# Patient Record
Sex: Male | Born: 1950
Health system: Southern US, Community
[De-identification: ages and names within clinical notes are randomized; demographics above are authoritative.]

## PROBLEM LIST (undated history)

## (undated) DIAGNOSIS — J439 Emphysema, unspecified: Secondary | ICD-10-CM

## (undated) HISTORY — DX: Emphysema, unspecified: J43.9

---

## 2003-06-14 ENCOUNTER — Emergency Department (HOSPITAL_COMMUNITY): Admission: EM | Admit: 2003-06-14 | Discharge: 2003-06-14 | Payer: Self-pay | Admitting: Emergency Medicine

## 2008-06-13 HISTORY — PX: CHOLECYSTECTOMY: SHX55

## 2009-02-19 ENCOUNTER — Inpatient Hospital Stay (HOSPITAL_COMMUNITY): Admission: EM | Admit: 2009-02-19 | Discharge: 2009-02-22 | Payer: Self-pay | Admitting: Emergency Medicine

## 2009-02-19 ENCOUNTER — Ambulatory Visit: Payer: Self-pay | Admitting: Gastroenterology

## 2009-02-20 ENCOUNTER — Encounter (INDEPENDENT_AMBULATORY_CARE_PROVIDER_SITE_OTHER): Payer: Self-pay | Admitting: Internal Medicine

## 2009-02-20 ENCOUNTER — Encounter: Payer: Self-pay | Admitting: Gastroenterology

## 2009-02-21 ENCOUNTER — Encounter (INDEPENDENT_AMBULATORY_CARE_PROVIDER_SITE_OTHER): Payer: Self-pay | Admitting: Surgery

## 2010-09-17 LAB — CBC
HCT: 42.7 % (ref 39.0–52.0)
HCT: 43.7 % (ref 39.0–52.0)
Hemoglobin: 14.6 g/dL (ref 13.0–17.0)
Hemoglobin: 15 g/dL (ref 13.0–17.0)
MCHC: 34.1 g/dL (ref 30.0–36.0)
MCHC: 34.2 g/dL (ref 30.0–36.0)
MCHC: 34.5 g/dL (ref 30.0–36.0)
MCV: 94.7 fL (ref 78.0–100.0)
MCV: 95.1 fL (ref 78.0–100.0)
MCV: 95.5 fL (ref 78.0–100.0)
Platelets: 155 10*3/uL (ref 150–400)
Platelets: 185 10*3/uL (ref 150–400)
RBC: 4.27 MIL/uL (ref 4.22–5.81)
RBC: 4.47 MIL/uL (ref 4.22–5.81)
RBC: 4.62 MIL/uL (ref 4.22–5.81)
RDW: 13.5 % (ref 11.5–15.5)
RDW: 13.6 % (ref 11.5–15.5)
WBC: 10 10*3/uL (ref 4.0–10.5)
WBC: 15.7 10*3/uL — ABNORMAL HIGH (ref 4.0–10.5)

## 2010-09-17 LAB — HEPATIC FUNCTION PANEL
ALT: 75 U/L — ABNORMAL HIGH (ref 0–53)
AST: 129 U/L — ABNORMAL HIGH (ref 0–37)
Albumin: 2.5 g/dL — ABNORMAL LOW (ref 3.5–5.2)
Albumin: 3.3 g/dL — ABNORMAL LOW (ref 3.5–5.2)
Alkaline Phosphatase: 72 U/L (ref 39–117)
Bilirubin, Direct: 0.3 mg/dL (ref 0.0–0.3)
Indirect Bilirubin: 0.5 mg/dL (ref 0.3–0.9)
Total Bilirubin: 0.8 mg/dL (ref 0.3–1.2)
Total Protein: 5.1 g/dL — ABNORMAL LOW (ref 6.0–8.3)
Total Protein: 5.7 g/dL — ABNORMAL LOW (ref 6.0–8.3)

## 2010-09-17 LAB — BASIC METABOLIC PANEL
BUN: 11 mg/dL (ref 6–23)
CO2: 27 mEq/L (ref 19–32)
Calcium: 8.4 mg/dL (ref 8.4–10.5)
Chloride: 107 mEq/L (ref 96–112)
Creatinine, Ser: 1.08 mg/dL (ref 0.4–1.5)
GFR calc Af Amer: >60 mL/min (ref >60)
GFR calc non Af Amer: >60 mL/min (ref >60)
Glucose, Bld: 117 mg/dL — ABNORMAL HIGH (ref 70–99)
Potassium: 3.7 mEq/L (ref 3.5–5.1)
Sodium: 142 mEq/L (ref 135–145)

## 2010-09-17 LAB — COMPREHENSIVE METABOLIC PANEL
ALT: 437 U/L — ABNORMAL HIGH (ref 0–53)
ALT: 562 U/L — ABNORMAL HIGH (ref 0–53)
AST: 190 U/L — ABNORMAL HIGH (ref 0–37)
Alkaline Phosphatase: 112 U/L (ref 39–117)
BUN: 7 mg/dL (ref 6–23)
CO2: 22 mEq/L (ref 19–32)
CO2: 24 mEq/L (ref 19–32)
Calcium: 8.1 mg/dL — ABNORMAL LOW (ref 8.4–10.5)
Chloride: 113 mEq/L — ABNORMAL HIGH (ref 96–112)
Creatinine, Ser: 0.86 mg/dL (ref 0.4–1.5)
Creatinine, Ser: 0.9 mg/dL (ref 0.4–1.5)
GFR calc Af Amer: >60 mL/min (ref >60)
GFR calc non Af Amer: >60 mL/min (ref >60)
GFR calc non Af Amer: >60 mL/min (ref >60)
Glucose, Bld: 93 mg/dL (ref 70–99)
Total Bilirubin: 2 mg/dL — ABNORMAL HIGH (ref 0.3–1.2)

## 2010-09-17 LAB — LIPASE, BLOOD: Lipase: 26 U/L (ref 11–59)

## 2010-09-17 LAB — LACTIC ACID, PLASMA: Lactic Acid, Venous: 2 mmol/L (ref 0.5–2.2)

## 2010-09-17 LAB — CULTURE, BLOOD (ROUTINE X 2)
Culture: NO GROWTH
Culture: NO GROWTH

## 2010-09-17 LAB — URINALYSIS, ROUTINE W REFLEX MICROSCOPIC
Bilirubin Urine: NEGATIVE
Glucose, UA: NEGATIVE mg/dL
Hgb urine dipstick: NEGATIVE
Ketones, ur: NEGATIVE mg/dL
Nitrite: NEGATIVE
Protein, ur: NEGATIVE mg/dL
Specific Gravity, Urine: >1.046 — ABNORMAL HIGH (ref 1.005–1.030)
Urobilinogen, UA: 4 mg/dL — ABNORMAL HIGH (ref 0.0–1.0)
pH: 6 (ref 5.0–8.0)

## 2010-09-17 LAB — POCT I-STAT, CHEM 8
BUN: 14 mg/dL (ref 6–23)
Calcium, Ion: 1 mmol/L — ABNORMAL LOW (ref 1.12–1.32)
Chloride: 105 mEq/L (ref 96–112)
Creatinine, Ser: 0.9 mg/dL (ref 0.4–1.5)
Glucose, Bld: 106 mg/dL — ABNORMAL HIGH (ref 70–99)
HCT: 45 % (ref 39.0–52.0)
Hemoglobin: 15.3 g/dL (ref 13.0–17.0)
Potassium: 3.7 mEq/L (ref 3.5–5.1)
Sodium: 141 mEq/L (ref 135–145)
TCO2: 25 mmol/L (ref 0–100)

## 2010-09-17 LAB — URINE CULTURE
Colony Count: NO GROWTH
Culture: NO GROWTH

## 2010-09-17 LAB — DIFFERENTIAL
Basophils Absolute: 0 10*3/uL (ref 0.0–0.1)
Basophils Absolute: 0 10*3/uL (ref 0.0–0.1)
Basophils Relative: 0 % (ref 0–1)
Basophils Relative: 0 % (ref 0–1)
Eosinophils Absolute: 0.1 10*3/uL (ref 0.0–0.7)
Eosinophils Absolute: 0.2 10*3/uL (ref 0.0–0.7)
Eosinophils Absolute: 0.2 10*3/uL (ref 0.0–0.7)
Eosinophils Relative: 1 % (ref 0–5)
Eosinophils Relative: 2 % (ref 0–5)
Lymphocytes Relative: 19 % (ref 12–46)
Lymphocytes Relative: 8 % — ABNORMAL LOW (ref 12–46)
Lymphocytes Relative: 8 % — ABNORMAL LOW (ref 12–46)
Lymphs Abs: 1.2 10*3/uL (ref 0.7–4.0)
Lymphs Abs: 1.3 10*3/uL (ref 0.7–4.0)
Monocytes Absolute: 0.9 10*3/uL (ref 0.1–1.0)
Monocytes Relative: 6 % (ref 3–12)
Neutro Abs: 13.5 10*3/uL — ABNORMAL HIGH (ref 1.7–7.7)
Neutrophils Relative %: 68 % (ref 43–77)
Neutrophils Relative %: 86 % — ABNORMAL HIGH (ref 43–77)

## 2010-09-17 LAB — MAGNESIUM: Magnesium: 1.9 mg/dL (ref 1.5–2.5)

## 2010-09-17 LAB — PROTIME-INR
INR: 1 (ref 0.00–1.49)
Prothrombin Time: 13.3 seconds (ref 11.6–15.2)

## 2010-09-17 LAB — APTT: aPTT: 25 seconds (ref 24–37)

## 2010-09-17 LAB — CK TOTAL AND CKMB (NOT AT ARMC)
CK, MB: 1.4 ng/mL (ref 0.3–4.0)
Relative Index: INVALID (ref 0.0–2.5)
Total CK: 95 U/L (ref 7–232)

## 2010-09-17 LAB — POCT CARDIAC MARKERS
CKMB, poc: <1 ng/mL — ABNORMAL LOW (ref 1.0–8.0)
Myoglobin, poc: 65.5 ng/mL (ref 12–200)
Troponin i, poc: <0.05 ng/mL (ref 0.00–0.09)

## 2010-09-17 LAB — TROPONIN I: Troponin I: <0.01 ng/mL (ref 0.00–0.06)

## 2010-10-29 NOTE — Consult Note (Signed)
Eugene Patel, Eugene Patel                           ACCOUNT NO.:  [id number]   MEDICAL RECORD NO.:  [id number]                   PATIENT TYPE:  EMS   LOCATION:  MINO                                 FACILITY:  [facility]   PHYSICIAN:  Eugene Fitch. Naaman Patel., M.D.          DATE OF BIRTH:  May 29, 1951   DATE OF CONSULTATION:  06/14/2003  DATE OF DISCHARGE:                                   CONSULTATION   HAND TRAUMA CALL CONSULTATION NOTE   CHIEF COMPLAINT:  Wood splitter crushing injury right small finger leading  to amputation of the nail, pulp and distal segment.   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  He sustained a crushing, avulsive amputation of his  right small finger.  He was transported to the [facility] Emergency Room where he  was evaluated by Dr. Effie Shy and the other emergency room staff.  X-ray of his  finger documented amputation of the tuft of his distal phalanx and  inspection of the finger revealed an oblique amputation with avulsion of the  pulp, 60% of the nail plate and nail matrix, and significant soiling with  foreign material.   His past medical history was reviewed in detail.   Eugene Patel has no drug allergies.  He is on no medications at this time.  He  has had no prior surgery, and no serious illnesses.  There is no history of  diabetes, thyroid disease, or hypertension.   SOCIAL HISTORY:  He is married.  He has smoked one pack of cigarettes daily  for 35 years.  He has not had an alcoholic beverage in 17 years.  His last  solid food was at 7 a.m.   FAMILY HISTORY:  Detailed and noncontributory.   REVIEW OF SYSTEMS:  Detailed and otherwise noncontributory.   PHYSICAL EXAMINATION:  VITAL SIGNS:  Temperature 97.1, pulse 80 regular,  respirations 20, his blood  pressure was 108/70 sitting left arm.  CLINICAL EXAMINATION:  Revealed an awake and alert 60 year old man with an  obvious injury to his right hand with his small finger covered with a bloody  gauze.  HEENT:  Atraumatic.  NECK:  Had full range of motion without pain.  CHEST:  Clear to auscultation.  HEART:  Regular rate and rhythm.  S1 and S2.  No murmur or gallop  appreciated.  He had no lymphadenopathy.  ABDOMEN:  Soft with active bowel sounds.  GU:  Noncontributory.  NEUROLOGIC:  Intact except for the small finger, which had a traumatic  amputation of the distal segment.  SKIN:  Normal otherwise.  EXTREMITIES:  Upper extremity orthopedic exam was unremarkable except for  his aforementioned avulsive traumatic injury to the right small finger.   His x-rays were  reviewed revealing a fracture amputation of the distal  segment of his distal phalanx and tuft.   ASSESSMENT:  Crushing avulsive traumatic amputation of right small finger.   PLAN:  After Anesthesia consultation, he will be admitted for outpatient  reconstruction of his finger.  He will be discharged home to the care of his  family.  He was advised that he will remain out of work approximately 2  weeks as a driver and will be given prescriptions for pain including  narcotic analgesics of Percocet, nonsteroidal medication including Motrin,  and prophylactic antibiotics in the form of Keflex 500 mg one p.o. q. 8  hours x5 days.                                               Eugene Patel., M.D.    RVS/MEDQ  D:  06/14/2003  T:  06/14/2003  Job:  [id number]   cc:   Eugene Patel ?????, Dr.   Valli Patel

## 2010-10-29 NOTE — Op Note (Signed)
NAMEJAMAREA, SELNER                           ACCOUNT NO.:  [id number]   MEDICAL RECORD NO.:  [id number]                   PATIENT TYPE:  EMS   LOCATION:  MINO                                 FACILITY:  [facility]   PHYSICIAN:  Katy Fitch. Naaman Plummer., M.D.          DATE OF BIRTH:  1950-11-04   DATE OF PROCEDURE:  06/14/2003  DATE OF DISCHARGE:                                 OPERATIVE REPORT   PREOPERATIVE DIAGNOSIS:  Status post traumatic crushing amputation of right  small  finger  with loss of 60% of nail plate and matrix with amputation of  distal tuft and distal metaphysis of distal phalanx.   POSTOPERATIVE DIAGNOSIS:  Status post traumatic crushing amputation of right  small  finger  with loss of 60% of nail plate and matrix with amputation of  distal tuft and distal metaphysis of distal phalanx.   OPERATION:  1. Reconstruction of traumatic amputation of right small  finger.  2. Resection of ventral intermediate and dorsal nail fold for permanent     removal of right small  fingernail.   SURGEON:  Katy Fitch. Sypher, M.D.   ASSISTANT:  Marveen Reeks. Dasnoit, P.A.-C.   ANESTHESIA:  0.25% Marcaine and 1% Lidocaine and  metacarpal head level  block supplemented by IV sedation. Supervising anesthesiologist Kaylyn Layer.  Michelle Piper, M.D.   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 Earlier today while using a log splitter, he accidentally  caught his right small  finger in a wood splitting mechanism, spinning an  avulsive, crushing traumatic amputation of the right small finger  distal  segment.   He was transported to the [facility] emergency room  where he was  evaluated by Dr. Gray Bernhardt and noted to have an avulsive injury. A hand  surgery consult was requested. After informed consent was obtained he is  brought to the operating room at this time for revision of  his right small  finger.   DESCRIPTION OF PROCEDURE:  Meldrick Buttery is brought to the operating room  and placed in the supine position on the operating table. Following  consultation  with Dr. Michelle Piper, general sedation and  a metacarpal head level  block of the right small  finger  was selected as appropriate anesthesia.  Mr. Trotta had been essentially n.p.o. essentially since 7 a.m. with only a  sip of clear fluid approximately 3 hours prior to his injury. He was brought  to the operating room at approximately 3:15 p.m.   After light sedation, a 0.25% Marcaine and 1% Lidocaine metacarpal head  level block was placed at the base of the small finger. After 5  minutes the  anesthesia was satisfactory. The arm was prepped with Betadine soap and  solution and sterilely draped. Following exsanguination of the limb with a  gauze wrap, a  Penrose drain was placed at the base of the finger as  a  digital tourniquet.   The procedure commenced with orderly debridement of the fingertip.  Devitalized tissue  and foreign material were carefully  debrided with sharp  and forceps dissection followed  by thorough irrigation of the wound. The  nail matrix was excised by excision of the dorsal, intermediate and ventral  nail fold followed  by use of rongeur to clean the bone down through the  periosteum to be certain that all the nail elements were removed.   The distal phalanx was then shortened to the proximal metaphyseal level and  rounded like a bullet nose. The palmar skin was then shaped with a central  flap to cover the metaphysis of the distal phalanx, followed  by partial  closure of the marginal flaps. The wound was dressed with Xeroform, sterile  gauze and Coban. There were no apparent complications.   Mr. Calvin tolerated the surgery and anesthesia well. He was transferred to  the recovery room with stable vital signs. He will be discharged to home to  the care of his family  with  prescriptions for Percocet 5 mg 1 to  2 tablets  q.4-6h. p.r.n. pain, 20 tablets without refill, also Keflex 500 mg 1 p.o.  q.8h. x5 days as a prophylactic antibiotic and Motrin 600 mg 1 p.o. q.6h.  p.r.n. pain, 30 tablets with 1 refill.                                               Katy Fitch Naaman Plummer., M.D.    RVS/MEDQ  D:  06/14/2003  T:  06/14/2003  Job:  [id number]   cc:   Jenetta Downer, M.D.   Valli Glance

## 2012-12-19 ENCOUNTER — Ambulatory Visit (INDEPENDENT_AMBULATORY_CARE_PROVIDER_SITE_OTHER): Payer: Managed Care, Other (non HMO) | Admitting: Internal Medicine

## 2012-12-19 ENCOUNTER — Encounter: Payer: Self-pay | Admitting: Internal Medicine

## 2012-12-19 VITALS — BP 120/68 | HR 84 | Temp 98.0°F | Ht 73.0 in | Wt 157.0 lb

## 2012-12-19 DIAGNOSIS — Z23 Encounter for immunization: Secondary | ICD-10-CM

## 2012-12-19 DIAGNOSIS — F17200 Nicotine dependence, unspecified, uncomplicated: Secondary | ICD-10-CM | POA: Insufficient documentation

## 2012-12-19 DIAGNOSIS — L409 Psoriasis, unspecified: Secondary | ICD-10-CM

## 2012-12-19 DIAGNOSIS — Z0000 Encounter for general adult medical examination without abnormal findings: Secondary | ICD-10-CM

## 2012-12-19 DIAGNOSIS — L408 Other psoriasis: Secondary | ICD-10-CM

## 2012-12-19 DIAGNOSIS — Z125 Encounter for screening for malignant neoplasm of prostate: Secondary | ICD-10-CM

## 2012-12-19 DIAGNOSIS — Z1211 Encounter for screening for malignant neoplasm of colon: Secondary | ICD-10-CM

## 2012-12-19 LAB — LIPID PANEL
Cholesterol: 205 mg/dL — ABNORMAL HIGH (ref 0–200)
Total CHOL/HDL Ratio: 6
Triglycerides: 316 mg/dL — ABNORMAL HIGH (ref 0.0–149.0)
VLDL: 63.2 mg/dL — ABNORMAL HIGH (ref 0.0–40.0)

## 2012-12-19 MED ORDER — TRIAMCINOLONE ACETONIDE 0.1 % EX CREA: TOPICAL_CREAM | Freq: Two times a day (BID) | CUTANEOUS | Status: DC | PRN

## 2012-12-19 NOTE — Progress Notes (Signed)
Subjective:    Patient ID: Eugene Patel, male    DOB: [birth year], 62 y.o.   MRN: [id number]  HPI Here to establish No medical problems and hasn't seen a doctor in sometime  Smoker still Had been 3 PPD in past--now under 1PPD Not willing to stop  No current outpatient prescriptions on file prior to visit.   No current facility-administered medications on file prior to visit.    No Known Allergies  No past medical history on file.  Past Surgical History  Procedure Laterality Date  . Cholecystectomy  2010    Family History  Problem Relation Age of Onset  . Diabetes Father   . Cancer Neg Hx   . Heart disease Neg Hx   . Hypertension Neg Hx   . Diabetes Other     History   Social History  . Marital Status: Married    Spouse Name: N/A    Number of Children: 2  . Years of Education: N/A   Occupational History  . Truck driver     distance driving   Social History Main Topics  . Smoking status: Current Every Day Smoker  . Smokeless tobacco: Never Used     Comment: has cut back to under 1 PPD but not willing to stop.  Marland Kitchen Alcohol Use: No  . Drug Use: No  . Sexually Active: Not on file   Other Topics Concern  . Not on file   Social History Narrative  . No narrative on file   Review of Systems  Constitutional: Negative for fatigue and unexpected weight change.       No set exercise-- does gardening Wears seat belt  HENT: Positive for congestion and rhinorrhea. Negative for hearing loss, dental problem and tinnitus.        Dentures on top Partial on bottom  Eyes: Negative for visual disturbance.       No diplopia or unilateral vision loss  Respiratory: Positive for cough. Negative for chest tightness and shortness of breath.        Rare cough  Cardiovascular: Positive for palpitations. Negative for chest pain and leg swelling.       Heart goes faster with activity  Gastrointestinal: Negative for nausea, vomiting, abdominal pain, constipation and blood in  stool.       No heartburn  Endocrine: Negative for cold intolerance and heat intolerance.  Genitourinary: Negative for urgency, frequency and difficulty urinating.       No ED  Musculoskeletal: Negative for back pain, joint swelling and arthralgias.       Synovial cyst on radial right wrist  Skin: Positive for rash.       Chronic rash on right calf---bleeds with scratching  Allergic/Immunologic: Positive for environmental allergies. Negative for immunocompromised state.       Uses loratadine in spring  Neurological: Negative for dizziness, syncope, weakness, light-headedness, numbness and headaches.  Hematological: Negative for adenopathy. Does not bruise/bleed easily.  Psychiatric/Behavioral: Negative for sleep disturbance and dysphoric mood. The patient is not nervous/anxious.        Objective:   Physical Exam  Constitutional: He is oriented to person, place, and time. He appears well-developed and well-nourished. No distress.  HENT:  Head: Normocephalic and atraumatic.  Right Ear: External ear normal.  Left Ear: External ear normal.  Mouth/Throat: Oropharynx is clear and moist. No oropharyngeal exudate.  Eyes: Conjunctivae and EOM are normal. Pupils are equal, round, and reactive to light.  Neck: Normal range of motion. Neck supple.  No thyromegaly present.  Cardiovascular: Normal rate, regular rhythm, normal heart sounds and intact distal pulses.  Exam reveals no gallop.   No murmur heard. Pulmonary/Chest: Effort normal. No respiratory distress. He has no wheezes. He has no rales.  Decreased breath sounds but clear  Abdominal: Soft. Bowel sounds are normal. There is no tenderness.  Musculoskeletal: He exhibits no edema and no tenderness.  Lymphadenopathy:    He has no cervical adenopathy.  Neurological: He is alert and oriented to person, place, and time.  Skin: Rash noted. No erythema.  psoriaform rash on calf  Psychiatric: He has a normal mood and affect. His behavior is  normal.          Assessment & Plan:

## 2012-12-19 NOTE — Assessment & Plan Note (Signed)
Counseled 4-5 minutes 1-800 QUIT NOW info given

## 2012-12-19 NOTE — Assessment & Plan Note (Signed)
Healthy Discussed exercise Check fecal immunoassay PSA after discussion Glucose, lipid

## 2012-12-19 NOTE — Addendum Note (Signed)
Addended by: Sueanne Margarita on: 12/19/2012 12:57 PM   Modules accepted: Orders

## 2012-12-19 NOTE — Assessment & Plan Note (Signed)
Will try the TAC

## 2012-12-20 ENCOUNTER — Encounter: Payer: Self-pay | Admitting: *Deleted

## 2013-12-12 ENCOUNTER — Encounter: Payer: Self-pay | Admitting: Internal Medicine

## 2013-12-12 ENCOUNTER — Ambulatory Visit (INDEPENDENT_AMBULATORY_CARE_PROVIDER_SITE_OTHER): Payer: Managed Care, Other (non HMO) | Admitting: Internal Medicine

## 2013-12-12 VITALS — BP 116/70 | HR 88 | Temp 97.9°F | Wt 158.5 lb

## 2013-12-12 DIAGNOSIS — J209 Acute bronchitis, unspecified: Secondary | ICD-10-CM | POA: Insufficient documentation

## 2013-12-12 MED ORDER — HYDROCODONE-HOMATROPINE 5-1.5 MG/5ML PO SYRP: 5.0000 mL | ORAL_SOLUTION | Freq: Every evening | ORAL | Status: DC | PRN

## 2013-12-12 MED ORDER — TRIAMCINOLONE ACETONIDE 0.1 % EX CREA: TOPICAL_CREAM | Freq: Two times a day (BID) | CUTANEOUS | Status: DC | PRN

## 2013-12-12 MED ORDER — AMOXICILLIN 500 MG PO TABS: 1000.0000 mg | ORAL_TABLET | Freq: Two times a day (BID) | ORAL | Status: DC

## 2013-12-12 NOTE — Progress Notes (Signed)
   Subjective:    Patient ID: Eugene ShiSteven W Patel, male    DOB: [birth year], 63 y.o.   MRN: [id number]  HPI Having a bad cough for 3-4 days Some rhinorrhea Nasty gobs of sputum at times No fever since first day---took some tylenol then Had some chills at first Some dizziness with cough No night sweats  Some SOB--- has been using the inhaler Still not off the cigarettes---not ready to stop yet  Hasn't used any other meds other than robitussin DM  Current Outpatient Prescriptions on File Prior to Visit  Medication Sig Dispense Refill  . triamcinolone cream (KENALOG) 0.1 % Apply topically 2 (two) times daily as needed.  45 g  3   No current facility-administered medications on file prior to visit.    No Known Allergies  No past medical history on file.  Past Surgical History  Procedure Laterality Date  . Cholecystectomy  2010    Family History  Problem Relation Age of Onset  . Diabetes Father   . Cancer Neg Hx   . Heart disease Neg Hx   . Hypertension Neg Hx   . Diabetes Other     History   Social History  . Marital Status: Married    Spouse Name: N/A    Number of Children: 2  . Years of Education: N/A   Occupational History  . Truck driver     distance driving   Social History Main Topics  . Smoking status: Current Every Day Smoker  . Smokeless tobacco: Never Used     Comment: has cut back to under 1 PPD but not willing to stop.  Marland Kitchen. Alcohol Use: No  . Drug Use: No  . Sexual Activity: Not on file   Other Topics Concern  . Not on file   Social History Narrative  . No narrative on file   Review of Systems No vomiting or diarrhea No rash  Appetite is okay     Objective:   Physical Exam  Constitutional: He appears well-developed and well-nourished. No distress.  Frequent coarse cough  HENT:  No sinus tenderness Slight uvula injection Mild nasal congestion TMs normal  Neck: Normal range of motion. Neck supple. No thyromegaly present.    Pulmonary/Chest:  Decreased breath sounds Some expiratory wheeze and rhonchi--no crackles  Lymphadenopathy:    He has no cervical adenopathy.          Assessment & Plan:

## 2013-12-12 NOTE — Assessment & Plan Note (Signed)
High risk of bacterial infection with his chronic lung damage Will Rx with amoxil Hycodan for sleep Call if not better in 3-4 days

## 2013-12-12 NOTE — Progress Notes (Signed)
Pre visit review using our clinic review tool, if applicable. No additional management support is needed unless otherwise documented below in the visit note. 

## 2013-12-12 NOTE — Patient Instructions (Signed)
Please call if you are not better in the next 3-4 days. Set up your physical within the next year---when you will have time off.

## 2014-04-17 ENCOUNTER — Telehealth: Payer: Self-pay

## 2014-04-17 NOTE — Telephone Encounter (Signed)
Pt left v/m; pt requesting ref[facility] on [facility]er[facility] inhaler to [facility] [place]. Do not see albuterol inhaler on med list.Please advise. Last seen 12/12/2013.

## 2014-04-17 NOTE — Telephone Encounter (Signed)
Okay to refill inhaler #1 x 0 2 inhalations tid prn (with spacer---have him get one if he doesn't have) Remind him to set up for physical sometime soon

## 2014-04-18 MED ORDER — ALBUTEROL SULFATE HFA 108 (90 BASE) MCG/ACT IN AERS: 2.0000 | INHALATION_SPRAY | Freq: Three times a day (TID) | RESPIRATORY_TRACT | Status: DC | PRN

## 2014-04-18 NOTE — Telephone Encounter (Signed)
rx sent to pharmacy by e-script  

## 2015-02-09 ENCOUNTER — Encounter: Payer: Self-pay | Admitting: Internal Medicine

## 2015-02-09 ENCOUNTER — Ambulatory Visit (INDEPENDENT_AMBULATORY_CARE_PROVIDER_SITE_OTHER): Payer: Managed Care, Other (non HMO) | Admitting: Internal Medicine

## 2015-02-09 VITALS — BP 110/68 | HR 94 | Temp 98.2°F | Ht 73.0 in | Wt 164.0 lb

## 2015-02-09 DIAGNOSIS — Z125 Encounter for screening for malignant neoplasm of prostate: Secondary | ICD-10-CM | POA: Diagnosis not present

## 2015-02-09 DIAGNOSIS — L409 Psoriasis, unspecified: Secondary | ICD-10-CM | POA: Diagnosis not present

## 2015-02-09 DIAGNOSIS — J439 Emphysema, unspecified: Secondary | ICD-10-CM

## 2015-02-09 DIAGNOSIS — Z23 Encounter for immunization: Secondary | ICD-10-CM | POA: Diagnosis not present

## 2015-02-09 DIAGNOSIS — Z1211 Encounter for screening for malignant neoplasm of colon: Secondary | ICD-10-CM

## 2015-02-09 DIAGNOSIS — Z720 Tobacco use: Secondary | ICD-10-CM

## 2015-02-09 DIAGNOSIS — Z0000 Encounter for general adult medical examination without abnormal findings: Secondary | ICD-10-CM

## 2015-02-09 DIAGNOSIS — F17200 Nicotine dependence, unspecified, uncomplicated: Secondary | ICD-10-CM

## 2015-02-09 LAB — CBC WITH DIFFERENTIAL/PLATELET
BASOS ABS: 0.1 10*3/uL (ref 0.0–0.1)
BASOS PCT: 0.9 % (ref 0.0–3.0)
EOS ABS: 0.4 10*3/uL (ref 0.0–0.7)
Eosinophils Relative: 4.1 % (ref 0.0–5.0)
HCT: 47.8 % (ref 39.0–52.0)
HEMOGLOBIN: 15.9 g/dL (ref 13.0–17.0)
LYMPHS PCT: 20 % (ref 12.0–46.0)
Lymphs Abs: 1.9 10*3/uL (ref 0.7–4.0)
MCHC: 33.3 g/dL (ref 30.0–36.0)
MCV: 93.7 fl (ref 78.0–100.0)
MONO ABS: 0.6 10*3/uL (ref 0.1–1.0)
Monocytes Relative: 6.4 % (ref 3.0–12.0)
Neutro Abs: 6.7 10*3/uL (ref 1.4–7.7)
Neutrophils Relative %: 68.6 % (ref 43.0–77.0)
Platelets: 229 10*3/uL (ref 150.0–400.0)
RBC: 5.1 Mil/uL (ref 4.22–5.81)
RDW: 14.8 % (ref 11.5–15.5)
WBC: 9.7 10*3/uL (ref 4.0–10.5)

## 2015-02-09 LAB — COMPREHENSIVE METABOLIC PANEL
ALBUMIN: 4 g/dL (ref 3.5–5.2)
ALK PHOS: 66 U/L (ref 39–117)
ALT: 13 U/L (ref 0–53)
AST: 17 U/L (ref 0–37)
BILIRUBIN TOTAL: 1 mg/dL (ref 0.2–1.2)
BUN: 14 mg/dL (ref 6–23)
CALCIUM: 8.9 mg/dL (ref 8.4–10.5)
CHLORIDE: 105 meq/L (ref 96–112)
CO2: 28 mEq/L (ref 19–32)
CREATININE: 1 mg/dL (ref 0.40–1.50)
GFR: 79.51 mL/min (ref >60.00)
Glucose, Bld: 81 mg/dL (ref 70–99)
Potassium: 4.1 mEq/L (ref 3.5–5.1)
SODIUM: 140 meq/L (ref 135–145)
TOTAL PROTEIN: 6.4 g/dL (ref 6.0–8.3)

## 2015-02-09 LAB — PSA: PSA: 3.39 ng/mL (ref 0.10–4.00)

## 2015-02-09 MED ORDER — ALBUTEROL SULFATE HFA 108 (90 BASE) MCG/ACT IN AERS: 2.0000 | INHALATION_SPRAY | Freq: Three times a day (TID) | RESPIRATORY_TRACT | Status: DC | PRN

## 2015-02-09 MED ORDER — BUPROPION HCL ER (SR) 150 MG PO TB12: 150.0000 mg | ORAL_TABLET | Freq: Two times a day (BID) | ORAL | Status: DC

## 2015-02-09 MED ORDER — TIOTROPIUM BROMIDE MONOHYDRATE 18 MCG IN CAPS
18.0000 ug | ORAL_CAPSULE | Freq: Every day | RESPIRATORY_TRACT | Status: DC
Start: 2015-02-09 — End: 2016-03-04

## 2015-02-09 MED ORDER — TRIAMCINOLONE ACETONIDE 0.1 % EX CREA: TOPICAL_CREAM | Freq: Two times a day (BID) | CUTANEOUS | Status: DC | PRN

## 2015-02-09 NOTE — Addendum Note (Signed)
Addended by: Sueanne Margarita on: 02/09/2015 12:46 PM   Modules accepted: Orders

## 2015-02-09 NOTE — Assessment & Plan Note (Signed)
Isolated area Will continue local Rx

## 2015-02-09 NOTE — Progress Notes (Signed)
Subjective:    Patient ID: Eugene Patel, male    DOB: 29-Jan-1951, 64 y.o.   MRN: [id number]  HPI Here for physical  Breathing is worse Needs something to help him stop--but not allowed to have chantix Coughs frequently--- occasionally productive Patches didn't work for him Wants to try bupropion after discussion Used granddaughter's extra albuterol inhaler-- this has helped Used spiriva a long time ago--doesn't remember if it helped  Still with psoriasis Cream helps the itching but doesn't get rid of the rash Just on right calf  Current Outpatient Prescriptions on File Prior to Visit  Medication Sig Dispense Refill  . albuterol (PROVENTIL HFA;VENTOLIN HFA) 108 (90 BASE) MCG/ACT inhaler Inhale 2 puffs into the lungs 3 (three) times daily as needed. 1 Inhaler 0  . triamcinolone cream (KENALOG) 0.1 % Apply topically 2 (two) times daily as needed. 45 g 3   No current facility-administered medications on file prior to visit.    No Known Allergies  No past medical history on file.  Past Surgical History  Procedure Laterality Date  . Cholecystectomy  2010    Family History  Problem Relation Age of Onset  . Diabetes Father   . Cancer Neg Hx   . Heart disease Neg Hx   . Hypertension Neg Hx   . Diabetes Other     Social History   Social History  . Marital Status: Married    Spouse Name: N/A  . Number of Children: 2  . Years of Education: N/A   Occupational History  . Truck driver     distance driving   Social History Main Topics  . Smoking status: Current Every Day Smoker  . Smokeless tobacco: Never Used     Comment: has cut back to under 1 PPD but not willing to stop.  Marland Kitchen Alcohol Use: No  . Drug Use: No  . Sexual Activity: Not on file   Other Topics Concern  . Not on file   Social History Narrative   Review of Systems  Constitutional: Negative for fatigue and unexpected weight change.       Weight stable Wears seat belt  HENT: Negative for hearing  loss and tinnitus.        Full top, partial bottom  Eyes: Negative for visual disturbance.       No diplopia or unilateral vision loss  Respiratory: Positive for cough and shortness of breath. Negative for chest tightness.   Cardiovascular: Positive for palpitations. Negative for chest pain and leg swelling.       Palps if he exerts himself  Gastrointestinal: Negative for nausea, vomiting, abdominal pain, constipation and blood in stool.       No heartburn  Genitourinary: Negative for urgency, frequency and difficulty urinating.       No sex-no problem  Musculoskeletal: Negative for back pain, joint swelling and arthralgias.  Skin: Positive for rash.       No suspicious lesions  Allergic/Immunologic: Positive for environmental allergies. Negative for immunocompromised state.       Slight early spring symptoms--no meds  Neurological: Negative for dizziness, syncope, weakness, light-headedness, numbness and headaches.  Hematological: Negative for adenopathy. Bruises/bleeds easily.  Psychiatric/Behavioral: Negative for sleep disturbance and dysphoric mood. The patient is not nervous/anxious.        Objective:   Physical Exam  Constitutional: He is oriented to person, place, and time. He appears well-developed and well-nourished. No distress.  HENT:  Head: Normocephalic and atraumatic.  Right Ear: External  ear normal.  Left Ear: External ear normal.  Mouth/Throat: Oropharynx is clear and moist. No oropharyngeal exudate.  Eyes: Conjunctivae and EOM are normal. Pupils are equal, round, and reactive to light.  Neck: Normal range of motion. Neck supple. No thyromegaly present.  Cardiovascular: Normal rate, regular rhythm, normal heart sounds and intact distal pulses.  Exam reveals no gallop.   No murmur heard. Pulmonary/Chest: Effort normal. No respiratory distress. He has no wheezes. He has no rales.  Moderately reduced air movement but clear  Abdominal: Soft. There is no tenderness.    Musculoskeletal: He exhibits no edema or tenderness.  Lymphadenopathy:    He has no cervical adenopathy.  Neurological: He is alert and oriented to person, place, and time.  Skin:  Raised red rash on upper lateral right calf  Psychiatric: He has a normal mood and affect. His behavior is normal.          Assessment & Plan:

## 2015-02-09 NOTE — Assessment & Plan Note (Signed)
Breathing is worse Will try spiriva again to see if it helps function--only continue if it helps function

## 2015-02-09 NOTE — Progress Notes (Signed)
Pre visit review using our clinic review tool, if applicable. No additional management support is needed unless otherwise documented below in the visit note. 

## 2015-02-09 NOTE — Patient Instructions (Signed)
Start the bupropion at 1 nig htly for 3 nights--then take twice a day. Stop smoking after 1 week at twice a day. Continue the twice a day for at least 3-4 months---if you are doing well, you can cut down to once a day at that point. Try the spiriva for a month---if your breathing gets better, stay on it.

## 2015-02-09 NOTE — Assessment & Plan Note (Signed)
prevnar and flu vaccines today Will check PSA after discussion Fecal immunoassay

## 2015-02-09 NOTE — Assessment & Plan Note (Signed)
Will try bupropion after discussion

## 2015-02-10 ENCOUNTER — Encounter: Payer: Self-pay | Admitting: *Deleted

## 2015-08-10 ENCOUNTER — Ambulatory Visit (INDEPENDENT_AMBULATORY_CARE_PROVIDER_SITE_OTHER): Payer: Managed Care, Other (non HMO) | Admitting: Internal Medicine

## 2015-08-10 ENCOUNTER — Encounter: Payer: Self-pay | Admitting: Internal Medicine

## 2015-08-10 VITALS — BP 128/70 | HR 101 | Temp 98.4°F | Wt 167.0 lb

## 2015-08-10 DIAGNOSIS — J431 Panlobular emphysema: Secondary | ICD-10-CM

## 2015-08-10 DIAGNOSIS — F17200 Nicotine dependence, unspecified, uncomplicated: Secondary | ICD-10-CM

## 2015-08-10 DIAGNOSIS — Z720 Tobacco use: Secondary | ICD-10-CM | POA: Diagnosis not present

## 2015-08-10 MED ORDER — BUPROPION HCL ER (SR) 150 MG PO TB12: 150.0000 mg | ORAL_TABLET | Freq: Every day | ORAL | Status: DC

## 2015-08-10 NOTE — Assessment & Plan Note (Signed)
Did stop in October Discussed continuing the daily bupropion for now He can stop if he feels things are stable

## 2015-08-10 NOTE — Assessment & Plan Note (Signed)
Breathing is better---hard to tell if from the spiriva or stopping smoking Will continue though Uses the albuterol once every 1-2 weeks

## 2015-08-10 NOTE — Progress Notes (Signed)
   Subjective:    Patient ID: Eugene Patel, male    DOB: 01/09/1951, 65 y.o.   MRN: [id number]  HPI Here for follow up of COPD and smoking status  Stopped smoking in October Thinks the bupropion helped Took bid until about a month ago Now cut back to once a day   Is using spiriva Not coughing now for the most part Breathing is slightly better--still not great Is Eugene to walk further before he gets dyspneic  Current Outpatient Prescriptions on File Prior to Visit  Medication Sig Dispense Refill  . albuterol (PROVENTIL HFA;VENTOLIN HFA) 108 (90 BASE) MCG/ACT inhaler Inhale 2 puffs into the lungs 3 (three) times daily as needed. 1 Inhaler 1  . tiotropium (SPIRIVA) 18 MCG inhalation capsule Place 1 capsule (18 mcg total) into inhaler and inhale daily. 30 capsule 12  . triamcinolone cream (KENALOG) 0.1 % Apply topically 2 (two) times daily as needed. 45 g 3   No current facility-administered medications on file prior to visit.    No Known Allergies  No past medical history on file.  Past Surgical History  Procedure Laterality Date  . Cholecystectomy  2010    Family History  Problem Relation Age of Onset  . Diabetes Father   . Cancer Neg Hx   . Heart disease Neg Hx   . Hypertension Neg Hx   . Diabetes Other     Social History   Social History  . Marital Status: Married    Spouse Name: N/A  . Number of Children: 2  . Years of Education: N/A   Occupational History  . Truck driver     distance driving   Social History Main Topics  . Smoking status: Former Smoker    Quit date: 03/28/2015  . Smokeless tobacco: Never Used  . Alcohol Use: No  . Drug Use: No  . Sexual Activity: Not on file   Other Topics Concern  . Not on file   Social History Narrative   Review of Systems Appetite is better Weight is only up 3# No chest pain, palpitations, dizziness    Objective:   Physical Exam  Constitutional: He appears well-developed and well-nourished. No  distress.  Cardiovascular: Regular rhythm.  Exam reveals no gallop.   No murmur heard. Slight tachycardia  Pulmonary/Chest: Effort normal. No respiratory distress. He has no wheezes. He has no rales.  Decreased breath sounds but clear  Musculoskeletal: He exhibits no edema.  Psychiatric: He has a normal mood and affect. His behavior is normal.          Assessment & Plan:

## 2015-08-10 NOTE — Progress Notes (Signed)
Pre visit review using our clinic review tool, if applicable. No additional management support is needed unless otherwise documented below in the visit note. 

## 2016-01-18 ENCOUNTER — Ambulatory Visit (INDEPENDENT_AMBULATORY_CARE_PROVIDER_SITE_OTHER): Payer: Managed Care, Other (non HMO) | Admitting: Internal Medicine

## 2016-01-18 ENCOUNTER — Encounter: Payer: Self-pay | Admitting: Internal Medicine

## 2016-01-18 VITALS — BP 110/70 | HR 81 | Temp 97.4°F | Ht 72.75 in | Wt 168.0 lb

## 2016-01-18 DIAGNOSIS — Z0000 Encounter for general adult medical examination without abnormal findings: Secondary | ICD-10-CM | POA: Diagnosis not present

## 2016-01-18 DIAGNOSIS — J439 Emphysema, unspecified: Secondary | ICD-10-CM

## 2016-01-18 DIAGNOSIS — Z1211 Encounter for screening for malignant neoplasm of colon: Secondary | ICD-10-CM | POA: Diagnosis not present

## 2016-01-18 DIAGNOSIS — Z23 Encounter for immunization: Secondary | ICD-10-CM | POA: Diagnosis not present

## 2016-01-18 LAB — COMPREHENSIVE METABOLIC PANEL
ALT: 20 U/L (ref 0–53)
AST: 17 U/L (ref 0–37)
Albumin: 4 g/dL (ref 3.5–5.2)
Alkaline Phosphatase: 68 U/L (ref 39–117)
BILIRUBIN TOTAL: 1.1 mg/dL (ref 0.2–1.2)
BUN: 14 mg/dL (ref 6–23)
CO2: 29 meq/L (ref 19–32)
Calcium: 9.7 mg/dL (ref 8.4–10.5)
Chloride: 108 mEq/L (ref 96–112)
Creatinine, Ser: 1.05 mg/dL (ref 0.40–1.50)
GFR: 74.94 mL/min (ref >60.00)
GLUCOSE: 88 mg/dL (ref 70–99)
POTASSIUM: 4.8 meq/L (ref 3.5–5.1)
SODIUM: 143 meq/L (ref 135–145)
Total Protein: 6.8 g/dL (ref 6.0–8.3)

## 2016-01-18 LAB — CBC WITH DIFFERENTIAL/PLATELET
Basophils Absolute: 0.1 10*3/uL (ref 0.0–0.1)
Basophils Relative: 0.7 % (ref 0.0–3.0)
EOS PCT: 2.2 % (ref 0.0–5.0)
Eosinophils Absolute: 0.2 10*3/uL (ref 0.0–0.7)
HCT: 48.3 % (ref 39.0–52.0)
Hemoglobin: 16.4 g/dL (ref 13.0–17.0)
LYMPHS ABS: 1.6 10*3/uL (ref 0.7–4.0)
Lymphocytes Relative: 19.4 % (ref 12.0–46.0)
MCHC: 33.9 g/dL (ref 30.0–36.0)
MCV: 92.3 fl (ref 78.0–100.0)
MONOS PCT: 6.5 % (ref 3.0–12.0)
Monocytes Absolute: 0.5 10*3/uL (ref 0.1–1.0)
NEUTROS ABS: 5.9 10*3/uL (ref 1.4–7.7)
NEUTROS PCT: 71.2 % (ref 43.0–77.0)
PLATELETS: 249 10*3/uL (ref 150.0–400.0)
RBC: 5.23 Mil/uL (ref 4.22–5.81)
RDW: 15 % (ref 11.5–15.5)
WBC: 8.2 10*3/uL (ref 4.0–10.5)

## 2016-01-18 NOTE — Progress Notes (Signed)
Subjective:    Patient ID: Eugene Patel, male    DOB: [birth year], 65 y.o.   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 albuterol (PROVENTIL HFA;VENTOLIN HFA) 108 (90 BASE) MCG/ACT inhaler Inhale 2 puffs into the lungs 3 (three) times daily as needed. 1 Inhaler 1  . tiotropium (SPIRIVA) 18 MCG inhalation capsule Place 1 capsule (18 mcg total) into inhaler and inhale daily. 30 capsule 12  . triamcinolone cream (KENALOG) 0.1 % Apply topically 2 (two) times daily as needed. 45 g 3   No current facility-administered medications on file prior to visit.     No Known Allergies  No past medical history on file.  Past Surgical History:  Procedure Laterality Date  . CHOLECYSTECTOMY  2010    Family History  Problem Relation Age of Onset  . Diabetes Father   . Diabetes Other   . Cancer Neg Hx   . Heart disease Neg Hx   . Hypertension Neg Hx     Social History   Social History  . Marital status: Married    Spouse name: N/A  . Number of children: 2  . Years of education: N/A   Occupational History  . Truck driver     distance driving   Social History Main Topics  . Smoking status: Former Smoker    Quit date: 03/28/2015  . Smokeless tobacco: Never Used  . Alcohol use No  . Drug use: No  . Sexual activity: Not on file   Other Topics Concern  . Not on file   Social History Narrative   No living will   Requests wife make decisions for him.   Would accept  resuscitation but no prolonged ventilation   Probably wouldn't want tube feeds if cognitively unaware   Review of Systems  Constitutional: Negative for fatigue and unexpected weight change.       Wears seat belt  HENT: Negative for dental problem, hearing loss and tinnitus.        Full dentures  Eyes: Negative for visual disturbance.       No diplopia or unilateral vision loss  Respiratory: Positive for shortness of breath. Negative for cough, chest tightness and wheezing.   Cardiovascular: Negative for chest pain, palpitations and leg swelling.  Gastrointestinal: Negative for abdominal pain, blood in stool, constipation, nausea and vomiting.       No heartburn  Endocrine: Negative for polydipsia and polyuria.  Genitourinary: Negative for difficulty urinating, frequency and urgency.       No sex--no problem  Musculoskeletal: Negative for arthralgias, back pain and joint swelling.  Skin:       Cream helps the itching on leg rash  Allergic/Immunologic: Negative for environmental allergies and immunocompromised state.  Neurological: Negative for dizziness, syncope, weakness, light-headedness and headaches.  Hematological: Negative for adenopathy. Bruises/bleeds easily.  Psychiatric/Behavioral: Negative for dysphoric mood and sleep disturbance. The patient is not nervous/anxious.        Objective:  Physical Exam  Constitutional: He is oriented to person, place, and time. He appears well-developed and well-nourished. No distress.  HENT:  Head: Normocephalic and atraumatic.  Right Ear: External ear normal.  Left Ear: External ear normal.  Mouth/Throat: Oropharynx is clear and moist.  Eyes: Conjunctivae are normal. Pupils are equal, round, and reactive to light.  Neck: Normal range of motion. Neck supple. No thyromegaly present.  Cardiovascular: Normal rate, regular rhythm, normal heart sounds and intact distal pulses.  Exam reveals no gallop.   No murmur heard. Pulmonary/Chest:  Effort normal and breath sounds normal. No respiratory distress. He has no wheezes. He has no rales.  Abdominal: Soft. There is no tenderness.  Musculoskeletal: He exhibits no edema or tenderness.  Lymphadenopathy:    He has no cervical adenopathy.  Neurological: He is alert and oriented to person, place, and time.  Skin: No rash noted. No erythema.  Psychiatric: He has a normal mood and affect. His behavior is normal.          Assessment & Plan:

## 2016-01-18 NOTE — Assessment & Plan Note (Signed)
Doing well He will do the FIT this year--didn't get to it last year (and won't do colon) Prevnar today Prefers no zostavax Discussed exercise when he can

## 2016-01-18 NOTE — Progress Notes (Signed)
Pre visit review using our clinic review tool, if applicable. No additional management support is needed unless otherwise documented below in the visit note. 

## 2016-01-18 NOTE — Assessment & Plan Note (Signed)
Doing fairly well since not smoking Continue the spiriva

## 2016-01-18 NOTE — Addendum Note (Signed)
Addended by: Eual FinesBRIDGES, Ipek Westra P on: 01/18/2016 12:06 PM   Modules accepted: Orders

## 2016-03-04 ENCOUNTER — Other Ambulatory Visit: Payer: Self-pay | Admitting: Internal Medicine

## 2016-07-21 ENCOUNTER — Other Ambulatory Visit: Payer: Self-pay | Admitting: Internal Medicine

## 2017-01-23 ENCOUNTER — Ambulatory Visit (INDEPENDENT_AMBULATORY_CARE_PROVIDER_SITE_OTHER): Payer: Managed Care, Other (non HMO) | Admitting: Internal Medicine

## 2017-01-23 ENCOUNTER — Encounter: Payer: Self-pay | Admitting: Internal Medicine

## 2017-01-23 VITALS — BP 108/80 | HR 49 | Temp 97.4°F | Ht 72.25 in | Wt 149.0 lb

## 2017-01-23 DIAGNOSIS — J439 Emphysema, unspecified: Secondary | ICD-10-CM

## 2017-01-23 DIAGNOSIS — Z1211 Encounter for screening for malignant neoplasm of colon: Secondary | ICD-10-CM

## 2017-01-23 DIAGNOSIS — B356 Tinea cruris: Secondary | ICD-10-CM | POA: Diagnosis not present

## 2017-01-23 DIAGNOSIS — Z0000 Encounter for general adult medical examination without abnormal findings: Secondary | ICD-10-CM

## 2017-01-23 DIAGNOSIS — Z125 Encounter for screening for malignant neoplasm of prostate: Secondary | ICD-10-CM | POA: Diagnosis not present

## 2017-01-23 LAB — CBC WITH DIFFERENTIAL/PLATELET
Basophils Absolute: 0.1 K/uL (ref 0.0–0.1)
Basophils Relative: 1.3 % (ref 0.0–3.0)
Eosinophils Absolute: 0.1 K/uL (ref 0.0–0.7)
Eosinophils Relative: 1.3 % (ref 0.0–5.0)
HCT: 51.7 % (ref 39.0–52.0)
Hemoglobin: 17.4 g/dL — ABNORMAL HIGH (ref 13.0–17.0)
Lymphocytes Relative: 15.3 % (ref 12.0–46.0)
Lymphs Abs: 1.3 K/uL (ref 0.7–4.0)
MCHC: 33.6 g/dL (ref 30.0–36.0)
MCV: 95.9 fl (ref 78.0–100.0)
Monocytes Absolute: 0.6 K/uL (ref 0.1–1.0)
Monocytes Relative: 7.4 % (ref 3.0–12.0)
Neutro Abs: 6.4 K/uL (ref 1.4–7.7)
Neutrophils Relative %: 74.7 % (ref 43.0–77.0)
Platelets: 226 K/uL (ref 150.0–400.0)
RBC: 5.39 Mil/uL (ref 4.22–5.81)
RDW: 13.8 % (ref 11.5–15.5)
WBC: 8.6 K/uL (ref 4.0–10.5)

## 2017-01-23 LAB — COMPREHENSIVE METABOLIC PANEL
ALBUMIN: 4.3 g/dL (ref 3.5–5.2)
ALK PHOS: 79 U/L (ref 39–117)
ALT: 15 U/L (ref 0–53)
AST: 17 U/L (ref 0–37)
BUN: 13 mg/dL (ref 6–23)
CALCIUM: 9.5 mg/dL (ref 8.4–10.5)
CHLORIDE: 104 meq/L (ref 96–112)
CO2: 29 mEq/L (ref 19–32)
Creatinine, Ser: 0.88 mg/dL (ref 0.40–1.50)
GFR: 91.61 mL/min (ref >60.00)
Glucose, Bld: 86 mg/dL (ref 70–99)
POTASSIUM: 5 meq/L (ref 3.5–5.1)
Sodium: 140 mEq/L (ref 135–145)
TOTAL PROTEIN: 6.1 g/dL (ref 6.0–8.3)
Total Bilirubin: 1.1 mg/dL (ref 0.2–1.2)

## 2017-01-23 LAB — PSA: PSA: 6.51 ng/mL — AB (ref 0.10–4.00)

## 2017-01-23 MED ORDER — TIOTROPIUM BROMIDE MONOHYDRATE 18 MCG IN CAPS: ORAL_CAPSULE | RESPIRATORY_TRACT | 11 refills | Status: DC

## 2017-01-23 MED ORDER — TRIAMCINOLONE ACETONIDE 0.1 % EX CREA
TOPICAL_CREAM | CUTANEOUS | 3 refills | Status: DC
Start: 2017-01-23 — End: 2018-04-23

## 2017-01-23 MED ORDER — KETOCONAZOLE 2 % EX CREA: 1.0000 "application " | TOPICAL_CREAM | Freq: Two times a day (BID) | CUTANEOUS | 3 refills | Status: DC | PRN

## 2017-01-23 NOTE — Assessment & Plan Note (Signed)
Will give ketoconazole cream If persists, fluconazole

## 2017-01-23 NOTE — Progress Notes (Signed)
Subjective:    Patient ID: Eugene Patel, male    DOB: Jan 27, 1951, 66 y.o.   MRN: [id number]  HPI Here for physical  Still working full time On the road all week  Some rash in his groin Some itching---TAC helps but doesn't resolve  Still not smoking Wife and co-driver also still smokes  Not as much cough Stable DOE No wheezing Still on spiriva Rarely uses rescue inhaler  Current Outpatient Prescriptions on File Prior to Visit  Medication Sig Dispense Refill  . albuterol (PROVENTIL HFA;VENTOLIN HFA) 108 (90 BASE) MCG/ACT inhaler Inhale 2 puffs into the lungs 3 (three) times daily as needed. 1 Inhaler 1  . SPIRIVA HANDIHALER 18 MCG inhalation capsule INHALE ONE DOSE BY MOUTH ONCE DAILY 30 capsule 12  . triamcinolone cream (KENALOG) 0.1 % APPLY  CREAM EXTERNALLY TWICE DAILY AS NEEDED 45 g 3   No current facility-administered medications on file prior to visit.     No Known Allergies  No past medical history on file.  Past Surgical History:  Procedure Laterality Date  . CHOLECYSTECTOMY  2010    Family History  Problem Relation Age of Onset  . Diabetes Father   . Diabetes Other   . Cancer Neg Hx   . Heart disease Neg Hx   . Hypertension Neg Hx     Social History   Social History  . Marital status: Married    Spouse name: N/A  . Number of children: 2  . Years of education: N/A   Occupational History  . Truck driver     distance driving   Social History Main Topics  . Smoking status: Former Smoker    Quit date: 03/28/2015  . Smokeless tobacco: Never Used  . Alcohol use No     Comment: Exposed to 2nd hand smoke  . Drug use: No  . Sexual activity: Not on file   Other Topics Concern  . Not on file   Social History Narrative   No living will   Requests wife make decisions for him.   Would accept resuscitation but no prolonged ventilation   Probably wouldn't want tube feeds if cognitively unaware   Review of Systems  Constitutional: Positive  for unexpected weight change.       Stopped eating all the sweets (had started due to not smoking) He feels this is close to his normal Wears seat belt Tries to walk when on break from the truck  HENT: Positive for tinnitus. Negative for hearing loss and trouble swallowing.        Full dentures  Eyes: Negative for visual disturbance.       No diplopia or unilateral vision loss   Respiratory: Positive for cough and shortness of breath. Negative for chest tightness.   Cardiovascular: Negative for chest pain, palpitations and leg swelling.  Gastrointestinal: Negative for abdominal pain, blood in stool, constipation and nausea.  Genitourinary: Negative for difficulty urinating and urgency.       No sex--no problems  Musculoskeletal: Negative for arthralgias, back pain and joint swelling.  Skin: Negative for rash.       No suspicious lesions  Allergic/Immunologic: Positive for environmental allergies. Negative for immunocompromised state.       Uses rescue inhaler more in spring  Neurological: Negative for dizziness, syncope, light-headedness and headaches.  Hematological: Negative for adenopathy. Bruises/bleeds easily.  Psychiatric/Behavioral: Negative for dysphoric mood and sleep disturbance. The patient is not nervous/anxious.        Objective:  Physical Exam  Constitutional: He is oriented to person, place, and time. He appears well-developed and well-nourished. No distress.  HENT:  Head: Normocephalic and atraumatic.  Right Ear: External ear normal.  Left Ear: External ear normal.  Mouth/Throat: Oropharynx is clear and moist.  Eyes: Pupils are equal, round, and reactive to light. Conjunctivae are normal.  Neck: Normal range of motion. Neck supple. No thyromegaly present.  Cardiovascular: Normal rate, regular rhythm, normal heart sounds and intact distal pulses.  Exam reveals no gallop.   No murmur heard. Faint pedal pulses  Pulmonary/Chest: Effort normal. No respiratory  distress. He has no wheezes. He has no rales.  Decreased breath sounds but clear  Abdominal: Soft. There is no tenderness.  Musculoskeletal: He exhibits no edema or tenderness.  Lymphadenopathy:    He has no cervical adenopathy.  Neurological: He is alert and oriented to person, place, and time.  Skin: No erythema.  Classic slightly red raised rash in groin  Psychiatric: He has a normal mood and affect. His behavior is normal.          Assessment & Plan:

## 2017-01-23 NOTE — Assessment & Plan Note (Signed)
I have personally reviewed the Medicare Annual Wellness questionnaire and have noted 1. The patient's medical and social history 2. Their use of alcohol, tobacco or illicit drugs 3. Their current medications and supplements 4. The patient's functional ability including ADL's, fall risks, home safety risks and hearing or visual             impairment. 5. Diet and physical activities 6. Evidence for depression or mood disorders  The patients weight, height, BMI and visual acuity have been recorded in the chart I have made referrals, counseling and provided education to the patient based review of the above and I have provided the pt with a written personalized care plan for preventive services.  I have provided you with a copy of your personalized plan for preventive services. Please take the time to review along with your updated medication list.  FIT given again--he states he will do it this week (vacation) Discussed PSA --will check Yearly flu vaccine Should check for AAA when goes on Medicare Doesn't want lung cancer screening

## 2017-01-23 NOTE — Assessment & Plan Note (Signed)
Not smoking but gets second hand exposure Continues on spiriva

## 2017-01-24 ENCOUNTER — Other Ambulatory Visit: Payer: Self-pay | Admitting: Internal Medicine

## 2017-01-24 DIAGNOSIS — R972 Elevated prostate specific antigen [PSA]: Secondary | ICD-10-CM

## 2017-02-02 ENCOUNTER — Other Ambulatory Visit (INDEPENDENT_AMBULATORY_CARE_PROVIDER_SITE_OTHER): Payer: Managed Care, Other (non HMO)

## 2017-02-02 DIAGNOSIS — Z1211 Encounter for screening for malignant neoplasm of colon: Secondary | ICD-10-CM | POA: Diagnosis not present

## 2017-02-02 LAB — FECAL OCCULT BLOOD, IMMUNOCHEMICAL: FECAL OCCULT BLD: NEGATIVE

## 2017-05-01 ENCOUNTER — Other Ambulatory Visit (INDEPENDENT_AMBULATORY_CARE_PROVIDER_SITE_OTHER): Payer: Managed Care, Other (non HMO)

## 2017-05-01 DIAGNOSIS — R972 Elevated prostate specific antigen [PSA]: Secondary | ICD-10-CM

## 2017-05-01 NOTE — Addendum Note (Signed)
Addended by: Alvina ChouWALSH, Tadashi Burkel J on: 05/01/2017 08:27 AM   Modules accepted: Orders

## 2017-05-02 LAB — PSA, TOTAL AND FREE
PSA, % Free: 11 % (calc) — ABNORMAL LOW (ref >25)
PSA, FREE: 0.5 ng/mL
PSA, TOTAL: 4.7 ng/mL — AB (ref <=4.0)

## 2017-05-03 ENCOUNTER — Encounter: Payer: Self-pay | Admitting: *Deleted

## 2017-05-29 ENCOUNTER — Telehealth: Payer: Self-pay

## 2017-05-29 NOTE — Telephone Encounter (Signed)
Pt. Wanted information on last pneumonia vaccine.Given.

## 2018-01-29 ENCOUNTER — Ambulatory Visit (INDEPENDENT_AMBULATORY_CARE_PROVIDER_SITE_OTHER): Payer: Managed Care, Other (non HMO) | Admitting: Internal Medicine

## 2018-01-29 ENCOUNTER — Encounter: Payer: Self-pay | Admitting: Internal Medicine

## 2018-01-29 VITALS — BP 112/68 | HR 89 | Temp 97.6°F | Ht 72.25 in | Wt 140.0 lb

## 2018-01-29 DIAGNOSIS — Z0000 Encounter for general adult medical examination without abnormal findings: Secondary | ICD-10-CM

## 2018-01-29 DIAGNOSIS — J439 Emphysema, unspecified: Secondary | ICD-10-CM | POA: Diagnosis not present

## 2018-01-29 DIAGNOSIS — R972 Elevated prostate specific antigen [PSA]: Secondary | ICD-10-CM | POA: Diagnosis not present

## 2018-01-29 DIAGNOSIS — Z1211 Encounter for screening for malignant neoplasm of colon: Secondary | ICD-10-CM | POA: Diagnosis not present

## 2018-01-29 LAB — CBC
HEMATOCRIT: 49.4 % (ref 39.0–52.0)
Hemoglobin: 16.7 g/dL (ref 13.0–17.0)
MCHC: 33.7 g/dL (ref 30.0–36.0)
MCV: 93.5 fl (ref 78.0–100.0)
Platelets: 241 10*3/uL (ref 150.0–400.0)
RBC: 5.29 Mil/uL (ref 4.22–5.81)
RDW: 14.2 % (ref 11.5–15.5)
WBC: 7.3 10*3/uL (ref 4.0–10.5)

## 2018-01-29 LAB — COMPREHENSIVE METABOLIC PANEL
ALBUMIN: 4.2 g/dL (ref 3.5–5.2)
ALK PHOS: 73 U/L (ref 39–117)
ALT: 11 U/L (ref 0–53)
AST: 14 U/L (ref 0–37)
BUN: 13 mg/dL (ref 6–23)
CHLORIDE: 104 meq/L (ref 96–112)
CO2: 30 mEq/L (ref 19–32)
CREATININE: 1.01 mg/dL (ref 0.40–1.50)
Calcium: 9.7 mg/dL (ref 8.4–10.5)
GFR: 77.9 mL/min (ref >60.00)
Glucose, Bld: 89 mg/dL (ref 70–99)
Potassium: 4.7 mEq/L (ref 3.5–5.1)
Sodium: 140 mEq/L (ref 135–145)
TOTAL PROTEIN: 6.9 g/dL (ref 6.0–8.3)
Total Bilirubin: 0.9 mg/dL (ref 0.2–1.2)

## 2018-01-29 NOTE — Assessment & Plan Note (Signed)
Mild symptoms only Less cigarette exposure now spiriva daily

## 2018-01-29 NOTE — Assessment & Plan Note (Signed)
Discussed Will recheck today

## 2018-01-29 NOTE — Assessment & Plan Note (Signed)
Fairly healthy Yearly flu vaccine Doesn't want shingrix FIT Doing better with fitness

## 2018-01-29 NOTE — Progress Notes (Signed)
Subjective:    Patient ID: Eugene Patel, male    DOB: 11/30/1950, 67 y.o.   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 albuterol (PROVENTIL HFA;VENTOLIN HFA) 108 (90 BASE) MCG/ACT inhaler Inhale 2 puffs into the lungs 3 (three) times daily as needed. 1 Inhaler 1  . tiotropium (SPIRIVA HANDIHALER) 18 MCG inhalation capsule INHALE ONE DOSE BY MOUTH ONCE DAILY 30 capsule 11  . triamcinolone cream (KENALOG) 0.1 % APPLY  CREAM EXTERNALLY TWICE DAILY AS NEEDED 45 g 3   No current facility-administered medications on file prior to visit.     No Known Allergies  History reviewed. No pertinent past medical history.  Past Surgical History:  Procedure Laterality Date  . CHOLECYSTECTOMY  2010    Family History  Problem Relation Age of Onset  . Diabetes Father   . Diabetes Other   . Cancer Neg Hx   . Heart disease Neg Hx   . Hypertension Neg Hx     Social History   Socioeconomic History  . Marital status: Married    Spouse name: Not on file  . Number of children: 2  . Years of education: Not on file  . Highest education level: Not on file  Occupational History  . 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 Financial resource strain: Not on file  . Food insecurity:    Worry: Not on file    Inability: Not on file  . Transportation needs:    Medical: Not on file    Non-medical: Not on file  Tobacco Use  . Smoking status: Former Smoker    Last attempt to quit:  03/28/2015    Years since quitting: 2.8  . Smokeless tobacco: Never Used  Substance and Sexual Activity  . Alcohol use: No    Alcohol/week: 0.0 standard drinks    Comment: Exposed to 2nd hand smoke  . Drug use: No  . Sexual activity: Not on file  Lifestyle  . Physical activity:    Days per week: Not on file    Minutes per session: Not on file  . Stress: Not on file  Relationships  . Social connections:    Talks on phone: Not on file    Gets together: Not on file    Attends religious service: Not on file    Active member of club or organization: Not on file    Attends meetings of clubs or organizations: Not on file    Relationship status: Not on file  . Intimate partner violence:    Fear of current or ex partner: Not on file    Emotionally abused: Not on file    Physically abused: Not on file    Forced sexual activity: Not on file  Other Topics Concern  . Not on file  Social History Narrative   No living will   Requests wife make decisions for him.   Would accept resuscitation but no prolonged ventilation   Probably  wouldn't want tube feeds if cognitively unaware   Review of Systems  Constitutional: Negative for fatigue.       Lost weight--increased walking and gave up soft drinks  HENT: Negative for hearing loss.        Full dentures  Eyes: Negative for visual disturbance.       No diplopia or unilateral vision loss  Respiratory: Positive for cough. Negative for chest tightness.   Cardiovascular: Negative for chest pain, palpitations and leg swelling.  Gastrointestinal: Negative for blood in stool and constipation.       No heartburn Choked only if he eats too fast  Endocrine: Negative for polydipsia and polyuria.  Genitourinary: Negative for difficulty urinating and urgency.       No sexual problems  Musculoskeletal: Negative for back pain and joint swelling.  Skin:       Rash on leg helped by the TAC--stops the itching  Allergic/Immunologic: Negative for  environmental allergies and immunocompromised state.  Neurological: Negative for dizziness, syncope, light-headedness and headaches.  Hematological: Negative for adenopathy. Bruises/bleeds easily.  Psychiatric/Behavioral: Negative for dysphoric mood and sleep disturbance. The patient is not nervous/anxious.        Objective:   Physical Exam  Constitutional: He is oriented to person, place, and time. He appears well-developed. No distress.  HENT:  Head: Normocephalic and atraumatic.  Right Ear: External ear normal.  Left Ear: External ear normal.  Mouth/Throat: Oropharynx is clear and moist. No oropharyngeal exudate.  Eyes: Pupils are equal, round, and reactive to light. Conjunctivae are normal.  Neck: No thyromegaly present.  Cardiovascular: Normal rate, regular rhythm, normal heart sounds and intact distal pulses. Exam reveals no gallop.  No murmur heard. Respiratory: Effort normal. No respiratory distress. He has no wheezes. He has no rales.  Decreased breath sounds but clear  GI: Soft. There is no tenderness.  Musculoskeletal: He exhibits no edema or tenderness.  Restriction of abduction and int/ext rotation of right shoulder (likely adhesive capsulitis vs OA)  Lymphadenopathy:    He has no cervical adenopathy.  Neurological: He is alert and oriented to person, place, and time.  Skin: No rash noted. No erythema.  Psychiatric: He has a normal mood and affect. His behavior is normal.           Assessment & Plan:

## 2018-01-30 LAB — PSA, TOTAL AND FREE
PSA, % FREE: 13 % — AB (ref >25)
PSA, Free: 0.5 ng/mL
PSA, TOTAL: 4 ng/mL (ref <=4.0)

## 2018-02-06 ENCOUNTER — Other Ambulatory Visit: Payer: Self-pay | Admitting: Internal Medicine

## 2018-04-23 ENCOUNTER — Other Ambulatory Visit: Payer: Self-pay | Admitting: Internal Medicine

## 2018-06-07 ENCOUNTER — Telehealth: Payer: Self-pay | Admitting: Internal Medicine

## 2018-06-07 MED ORDER — TRIAMCINOLONE ACETONIDE 0.1 % EX CREA: TOPICAL_CREAM | CUTANEOUS | 3 refills | Status: DC

## 2018-06-07 NOTE — Telephone Encounter (Signed)
Nate/Pharmacy Tech with [facility] called office stating they have faxed over refill request several times for the Triamcinolone cream. He wanted to know if the fax had been received.

## 2018-06-07 NOTE — Telephone Encounter (Signed)
No, I have not received a request from [facility], but I went ahead and sent it in.

## 2018-07-05 ENCOUNTER — Other Ambulatory Visit: Payer: Self-pay | Admitting: Internal Medicine

## 2019-02-05 ENCOUNTER — Encounter: Payer: Managed Care, Other (non HMO) | Admitting: Internal Medicine

## 2019-03-08 ENCOUNTER — Other Ambulatory Visit: Payer: Self-pay | Admitting: Internal Medicine

## 2019-03-23 ENCOUNTER — Other Ambulatory Visit: Payer: Self-pay | Admitting: Internal Medicine

## 2019-03-25 NOTE — Telephone Encounter (Signed)
Electronic refill request Triamcinolone Last refill 06/07/18 #45 G/3 Last office visit 01/29/18 Upcoming appointment 06/03/19 Dr. Silvio Pate is out of the office

## 2019-06-03 ENCOUNTER — Encounter: Payer: Self-pay | Admitting: Internal Medicine

## 2019-06-03 ENCOUNTER — Other Ambulatory Visit: Payer: Self-pay

## 2019-06-03 ENCOUNTER — Ambulatory Visit (INDEPENDENT_AMBULATORY_CARE_PROVIDER_SITE_OTHER): Payer: Medicare HMO | Admitting: Internal Medicine

## 2019-06-03 VITALS — BP 110/76 | HR 77 | Temp 96.8°F | Ht 72.5 in | Wt 137.0 lb

## 2019-06-03 DIAGNOSIS — R972 Elevated prostate specific antigen [PSA]: Secondary | ICD-10-CM | POA: Diagnosis not present

## 2019-06-03 DIAGNOSIS — Z7189 Other specified counseling: Secondary | ICD-10-CM

## 2019-06-03 DIAGNOSIS — Z1211 Encounter for screening for malignant neoplasm of colon: Secondary | ICD-10-CM | POA: Diagnosis not present

## 2019-06-03 DIAGNOSIS — Z0000 Encounter for general adult medical examination without abnormal findings: Secondary | ICD-10-CM | POA: Diagnosis not present

## 2019-06-03 DIAGNOSIS — Z23 Encounter for immunization: Secondary | ICD-10-CM | POA: Diagnosis not present

## 2019-06-03 DIAGNOSIS — F17210 Nicotine dependence, cigarettes, uncomplicated: Secondary | ICD-10-CM | POA: Insufficient documentation

## 2019-06-03 DIAGNOSIS — E441 Mild protein-calorie malnutrition: Secondary | ICD-10-CM | POA: Insufficient documentation

## 2019-06-03 DIAGNOSIS — J439 Emphysema, unspecified: Secondary | ICD-10-CM

## 2019-06-03 LAB — COMPREHENSIVE METABOLIC PANEL
ALT: 11 U/L (ref 0–53)
AST: 17 U/L (ref 0–37)
Albumin: 4.3 g/dL (ref 3.5–5.2)
Alkaline Phosphatase: 76 U/L (ref 39–117)
BUN: 12 mg/dL (ref 6–23)
CO2: 28 mEq/L (ref 19–32)
Calcium: 9.8 mg/dL (ref 8.4–10.5)
Chloride: 103 mEq/L (ref 96–112)
Creatinine, Ser: 0.95 mg/dL (ref 0.40–1.50)
GFR: 78.36 mL/min (ref >60.00)
Glucose, Bld: 89 mg/dL (ref 70–99)
Potassium: 5.1 mEq/L (ref 3.5–5.1)
Sodium: 141 mEq/L (ref 135–145)
Total Bilirubin: 1.2 mg/dL (ref 0.2–1.2)
Total Protein: 6.8 g/dL (ref 6.0–8.3)

## 2019-06-03 LAB — CBC
HCT: 52.4 % — ABNORMAL HIGH (ref 39.0–52.0)
Hemoglobin: 17.5 g/dL — ABNORMAL HIGH (ref 13.0–17.0)
MCHC: 33.3 g/dL (ref 30.0–36.0)
MCV: 96.3 fl (ref 78.0–100.0)
Platelets: 251 10*3/uL (ref 150.0–400.0)
RBC: 5.44 Mil/uL (ref 4.22–5.81)
RDW: 14.6 % (ref 11.5–15.5)
WBC: 7.7 10*3/uL (ref 4.0–10.5)

## 2019-06-03 LAB — PSA: PSA: 6.77 ng/mL — ABNORMAL HIGH (ref 0.10–4.00)

## 2019-06-03 MED ORDER — BUPROPION HCL ER (SR) 150 MG PO TB12: 150.0000 mg | ORAL_TABLET | Freq: Two times a day (BID) | ORAL | 11 refills | Status: DC

## 2019-06-03 NOTE — Assessment & Plan Note (Signed)
See social history 

## 2019-06-03 NOTE — Assessment & Plan Note (Signed)
Needs to stop smoking Discussed supplements

## 2019-06-03 NOTE — Assessment & Plan Note (Addendum)
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

## 2019-06-03 NOTE — Progress Notes (Signed)
Subjective:    Patient ID: Eugene Patel, male    DOB: 1950-09-18, 68 y.o.   MRN: [id number]  HPI Here for Medicare wellness and follow up of chronic health conditions Reviewed form and advanced directives Reviewed other doctors No alcohol Has restarted smoking--wants to stop again Tries to walk when his truck is parked. Walks some at home also Vision is okay. 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 albuterol (PROVENTIL HFA;VENTOLIN HFA) 108 (90 BASE) MCG/ACT inhaler Inhale 2 puffs into the lungs 3 (three) times daily as needed. 1 Inhaler 1  . tiotropium (SPIRIVA HANDIHALER) 18 MCG inhalation capsule INHALE THE CONTENTS OF 1 CAPSULE VIA HANDIHALER BY MOUTH ONCE DAILY 30 capsule 11  . triamcinolone cream (KENALOG) 0.1 % APPLY TOPICALLY TO THE AFFECTED AREA TWICE DAILY AS NEEDED 45 g 0   No current facility-administered medications on file prior to visit.    No Known Allergies  History reviewed. No pertinent past medical history.  Past Surgical History:  Procedure Laterality Date  . CHOLECYSTECTOMY  2010    Family History  Problem Relation Age of Onset  . Diabetes Father   . Diabetes Other   . Cancer Neg Hx   . Heart disease Neg Hx   . Hypertension Neg Hx     Social History   Socioeconomic History  . Marital status: Married    Spouse name: Not on file  . Number of children: 2  . Years of education: Not on file  . Highest education level: Not on file  Occupational History  . 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  Smoking status: Current Every Day Smoker    Last attempt to quit: 03/28/2015    Years since quitting: 4.1  . Smokeless tobacco: Never Used  Substance and Sexual Activity  . Alcohol use: No    Alcohol/week: 0.0 standard drinks    Comment: Exposed to 2nd hand smoke  . Drug use: No  . Sexual activity: Not on file  Other Topics Concern  . Not on file  Social History Narrative   No living will   Requests wife make decisions for him, alternate is daughter Aram Beecham   Would accept resuscitation but no prolonged ventilation   Probably wouldn't want tube feeds if cognitively unaware   Social Determinants of Health   Financial Resource Strain:   . Difficulty of Paying Living Expenses: Not on file  Food Insecurity:   . Worried About Programme researcher, broadcasting/film/video in the Last Year: Not on file  . Ran Out of Food in the Last Year: Not on file  Transportation Needs:   . Lack of Transportation (Medical): Not on file  . Lack of Transportation (Non-Medical): Not on file  Physical Activity:   . Days of Exercise per Week: Not on file  . Minutes of Exercise per Session: Not on file  Stress:   . Feeling of Stress : Not on file  Social Connections:   . Frequency of Communication with Friends and Family: Not on file  . Frequency of Social Gatherings with Friends and  Family: Not on file  . Attends Religious Services: Not on file  . Active Member of Clubs or Organizations: Not on file  . Attends Archivist Meetings: Not on file  . Marital Status: Not on file  Intimate Partner Violence:   . Fear of Current or Ex-Partner: Not on file  . Emotionally Abused: Not on file  . Physically Abused: Not on file  . Sexually Abused: Not on file   Review of Systems  Appetite is okay Weight down more--feels it is due to the cigarettes Sleeps fine Wears seat belt No heartburn or dysphagia Bowels are fine--no blood No sig back or joint pains---other than the chronic right shoulder pain Some psoriasis on  right calf---cream takes away the itching No suspicious skin lesions Full dentures     Objective:   Physical Exam  Constitutional: He is oriented to person, place, and time. He appears well-developed. No distress.  Some muscle wasting in chest/arms  HENT:  Mouth/Throat: Oropharynx is clear and moist. No oropharyngeal exudate.  Neck: No thyromegaly present.  Cardiovascular: Normal rate, regular rhythm, normal heart sounds and intact distal pulses. Exam reveals no gallop.  No murmur heard. Respiratory: Effort normal. No respiratory distress. He has no wheezes. He has no rales.  Decreased breath sounds but clear  GI: Soft. There is no abdominal tenderness.  Musculoskeletal:        General: No tenderness or edema.  Lymphadenopathy:    He has no cervical adenopathy.  Neurological: He is alert and oriented to person, place, and time.  President-- "Daisy Floro, Obama, Elba Barman---" [id number] Recall 3/3  Skin: No erythema.  Benign nevi on abdomen and chest  Psychiatric: He has a normal mood and affect. His behavior is normal.           Assessment & Plan:

## 2019-06-03 NOTE — Assessment & Plan Note (Signed)
Stable mild symptoms On the spiriva Must stop smoking again

## 2019-06-03 NOTE — Assessment & Plan Note (Signed)
Discussed Will check again this year

## 2019-06-03 NOTE — Assessment & Plan Note (Signed)
I have personally reviewed the Medicare Annual Wellness questionnaire and have noted 1. The patient's medical and social history 2. Their use of alcohol, tobacco or illicit drugs 3. Their current medications and supplements 4. The patient's functional ability including ADL's, fall risks, home safety risks and hearing or visual             impairment. 5. Diet and physical activities 6. Evidence for depression or mood disorders  The patients weight, height, BMI and visual acuity have been recorded in the chart I have made referrals, counseling and provided education to the patient based review of the above and I have provided the pt with a written personalized care plan for preventive services.  I have provided you with a copy of your personalized plan for preventive services. Please take the time to review along with your updated medication list.  Flu vaccine today Doesn't want shingrix--discussed FIT Will check PSA again Discussed exercise

## 2019-06-03 NOTE — Progress Notes (Signed)
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

## 2019-06-03 NOTE — Addendum Note (Signed)
Addended by: Pilar Grammes on: 06/03/2019 12:48 PM   Modules accepted: Orders

## 2019-06-06 ENCOUNTER — Other Ambulatory Visit: Payer: Self-pay | Admitting: Internal Medicine

## 2019-06-06 DIAGNOSIS — R972 Elevated prostate specific antigen [PSA]: Secondary | ICD-10-CM

## 2019-08-12 ENCOUNTER — Other Ambulatory Visit: Payer: Self-pay | Admitting: Family Medicine

## 2019-08-13 NOTE — Telephone Encounter (Signed)
PCP filled Rx last on 03/25/19 #45 g with 0 refills, Dr. Alphonsus Sias out of the office please advise

## 2019-10-07 ENCOUNTER — Other Ambulatory Visit: Payer: Self-pay | Admitting: Family Medicine

## 2019-11-21 ENCOUNTER — Telehealth: Payer: Self-pay | Admitting: Internal Medicine

## 2019-11-21 MED ORDER — ALBUTEROL SULFATE HFA 108 (90 BASE) MCG/ACT IN AERS: 2.0000 | INHALATION_SPRAY | Freq: Three times a day (TID) | RESPIRATORY_TRACT | 0 refills | Status: DC | PRN

## 2019-11-21 NOTE — Telephone Encounter (Signed)
Rx sent electronically.  

## 2019-11-21 NOTE — Telephone Encounter (Signed)
Landon,[facility],called.  She's calling to request a rx for Albuterol Inhaler for patient.

## 2019-12-02 ENCOUNTER — Other Ambulatory Visit (INDEPENDENT_AMBULATORY_CARE_PROVIDER_SITE_OTHER): Payer: Managed Care, Other (non HMO)

## 2019-12-02 DIAGNOSIS — R972 Elevated prostate specific antigen [PSA]: Secondary | ICD-10-CM | POA: Diagnosis not present

## 2019-12-02 LAB — PSA: PSA: 5.98 ng/mL — ABNORMAL HIGH (ref 0.10–4.00)

## 2019-12-16 ENCOUNTER — Other Ambulatory Visit: Payer: Self-pay | Admitting: Internal Medicine

## 2020-01-06 ENCOUNTER — Other Ambulatory Visit: Payer: Self-pay | Admitting: Internal Medicine

## 2020-01-29 ENCOUNTER — Other Ambulatory Visit: Payer: Self-pay | Admitting: Internal Medicine

## 2020-02-24 ENCOUNTER — Other Ambulatory Visit: Payer: Self-pay | Admitting: Internal Medicine

## 2020-03-13 ENCOUNTER — Other Ambulatory Visit: Payer: Self-pay | Admitting: Internal Medicine

## 2020-04-22 ENCOUNTER — Other Ambulatory Visit: Payer: Self-pay | Admitting: Internal Medicine

## 2020-06-08 ENCOUNTER — Other Ambulatory Visit: Payer: Self-pay

## 2020-06-08 ENCOUNTER — Encounter: Payer: Self-pay | Admitting: Internal Medicine

## 2020-06-08 ENCOUNTER — Encounter: Payer: Managed Care, Other (non HMO) | Admitting: Internal Medicine

## 2020-06-08 ENCOUNTER — Ambulatory Visit (INDEPENDENT_AMBULATORY_CARE_PROVIDER_SITE_OTHER): Payer: Managed Care, Other (non HMO) | Admitting: Internal Medicine

## 2020-06-08 VITALS — BP 102/78 | HR 67 | Temp 97.3°F | Ht 72.5 in | Wt 138.0 lb

## 2020-06-08 DIAGNOSIS — Z1211 Encounter for screening for malignant neoplasm of colon: Secondary | ICD-10-CM

## 2020-06-08 DIAGNOSIS — Z23 Encounter for immunization: Secondary | ICD-10-CM

## 2020-06-08 DIAGNOSIS — L409 Psoriasis, unspecified: Secondary | ICD-10-CM

## 2020-06-08 DIAGNOSIS — R972 Elevated prostate specific antigen [PSA]: Secondary | ICD-10-CM | POA: Diagnosis not present

## 2020-06-08 DIAGNOSIS — E441 Mild protein-calorie malnutrition: Secondary | ICD-10-CM

## 2020-06-08 DIAGNOSIS — Z0000 Encounter for general adult medical examination without abnormal findings: Secondary | ICD-10-CM

## 2020-06-08 DIAGNOSIS — J439 Emphysema, unspecified: Secondary | ICD-10-CM

## 2020-06-08 MED ORDER — ALBUTEROL SULFATE HFA 108 (90 BASE) MCG/ACT IN AERS: 2.0000 | INHALATION_SPRAY | Freq: Three times a day (TID) | RESPIRATORY_TRACT | 2 refills | Status: DC | PRN

## 2020-06-08 MED ORDER — SPIRIVA HANDIHALER 18 MCG IN CAPS: 18.0000 ug | ORAL_CAPSULE | Freq: Every day | RESPIRATORY_TRACT | 11 refills | Status: DC

## 2020-06-08 MED ORDER — TRIAMCINOLONE ACETONIDE 0.1 % EX CREA: TOPICAL_CREAM | Freq: Two times a day (BID) | CUTANEOUS | 3 refills | Status: DC

## 2020-06-08 NOTE — Assessment & Plan Note (Signed)
Weight stable Discussed resistance training/healthy eating

## 2020-06-08 NOTE — Progress Notes (Signed)
Subjective:    Patient ID: Eugene Patel, male    DOB: [birth year], 69 y.o.   MRN: [id number]  HPI Here for physical--Medicare is his secondary insurance This visit occurred during the SARS-CoV-2 public health emergency.  Safety protocols were in place, including screening questions prior to the visit, additional usage of staff PPE, and extensive cleaning of exam room while observing appropriate contact time as indicated for disinfecting solutions.   Back to driving truck [employer]  Did stop smoking Breathing is "no better"---gets SOB pretty easy Just drives---doesn't deal with cargo No regular cough Occasional wheezing ---uses albuterol then  Appetite is fine Weight is stable now--hasn't gained or lost  Current Outpatient Medications on File Prior to Visit  Medication Sig Dispense Refill  . albuterol (VENTOLIN HFA) 108 (90 Base) MCG/ACT inhaler Inhale 2 puffs into the lungs 3 (three) times daily as needed. 6.7 g 0  . SPIRIVA HANDIHALER 18 MCG inhalation capsule INHALE THE CONTENTS OF 1 CAPSULE VIA HANDIHALER BY MOUTH ONCE DAILY 30 capsule 2  . triamcinolone cream (KENALOG) 0.1 % APPLY TOPICALLY TO THE AFFECTED AREA TWICE DAILY AS NEEDED 45 g 0   No current facility-administered medications on file prior to visit.    No Known Allergies  History reviewed. No pertinent past medical history.  Past Surgical History:  Procedure Laterality Date  . CHOLECYSTECTOMY  2010    Family History  Problem Relation Age of Onset  . Diabetes Father   . Diabetes Other   . Cancer Neg Hx   . Heart disease Neg Hx   . Hypertension Neg Hx     Social History   Socioeconomic History  . Marital status: Married    Spouse name: Not on file  . Number of children: 2  . Years of education: Not on file  . Highest education level: Not on file  Occupational History  . 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 Smoking status: Former Smoker    Types:  Cigarettes    Quit date: 06/14/2019    Years since quitting: 0.9  . Smokeless tobacco: Never Used  Substance and Sexual Activity  . Alcohol use: No    Alcohol/week: 0.0 standard drinks    Comment: Exposed to 2nd hand smoke  . Drug use: No  . Sexual activity: Not on file  Other Topics Concern  . Not on file  Social History Narrative   No living will   Requests wife make decisions for him, alternate is daughter Aram Beecham   Would accept resuscitation but no prolonged ventilation   Probably wouldn't want tube feeds if cognitively unaware   Social Determinants of Health   Financial Resource Strain: Not on file  Food Insecurity: Not on file  Transportation Needs: Not on file  Physical Activity: Not on file  Stress: Not on file  Social Connections: Not on file  Intimate Partner Violence: Not on file   Review of Systems  Constitutional: Negative for fatigue and unexpected weight change.       Wears seat belt  HENT: Positive for tinnitus. Negative for dental problem and hearing loss.        Full dentures  Eyes: Negative for visual disturbance.       No diplopia or unilateral vision loss  Respiratory: Positive for shortness of breath and wheezing. Negative for cough.   Cardiovascular: Negative for chest pain, palpitations and leg swelling.  Gastrointestinal: Negative for blood in stool and  constipation.       No heartburn  Endocrine: Negative for polydipsia and polyuria.  Genitourinary: Negative for difficulty urinating and urgency.       Mild decreased stream No sex --no problem  Musculoskeletal: Negative for arthralgias, back pain and joint swelling.  Skin:       Uses the cortisone cream daily on palm and feet  Allergic/Immunologic: Negative for environmental allergies and immunocompromised state.  Neurological: Negative for dizziness, syncope, light-headedness and headaches.  Hematological: Negative for adenopathy. Bruises/bleeds easily.       Frequent bruising on arms   Psychiatric/Behavioral: Negative for dysphoric mood and sleep disturbance. The patient is not nervous/anxious.        Objective:   Physical Exam Constitutional:      Appearance: Normal appearance.     Comments: Slim but not really wasting--seems to have sarcopenia upper body  HENT:     Right Ear: Tympanic membrane, ear canal and external ear normal.     Left Ear: Tympanic membrane, ear canal and external ear normal.     Mouth/Throat:     Pharynx: No oropharyngeal exudate or posterior oropharyngeal erythema.  Eyes:     Conjunctiva/sclera: Conjunctivae normal.     Pupils: Pupils are equal, round, and reactive to light.  Cardiovascular:     Rate and Rhythm: Normal rate and regular rhythm.     Pulses: Normal pulses.     Heart sounds: No murmur heard. No gallop.   Pulmonary:     Effort: Pulmonary effort is normal.     Breath sounds: No wheezing or rales.     Comments: Decreased breath sounds but clear Abdominal:     Palpations: Abdomen is soft.     Tenderness: There is no abdominal tenderness.  Musculoskeletal:     Cervical back: Neck supple.     Right lower leg: No edema.     Left lower leg: No edema.  Lymphadenopathy:     Cervical: No cervical adenopathy.  Skin:    General: Skin is warm.     Comments: Mild scaling on palms/feet  Neurological:     General: No focal deficit present.     Mental Status: He is alert and oriented to person, place, and time.  Psychiatric:        Mood and Affect: Mood normal.        Behavior: Behavior normal.            Assessment & Plan:

## 2020-06-08 NOTE — Addendum Note (Signed)
Addended by: Eual Fines on: 06/08/2020 03:18 PM   Modules accepted: Orders

## 2020-06-08 NOTE — Assessment & Plan Note (Signed)
Uses the steroid cream

## 2020-06-08 NOTE — Assessment & Plan Note (Signed)
Discussed the possibility of slowly progressing prostate cancer vs false elevation Will recheck and refer if goes up a lot

## 2020-06-08 NOTE — Assessment & Plan Note (Signed)
Healthy Needs FIT Flu vaccine today COVID booster due later Not excited about shingrix Discussed adding exercise--especially resistance

## 2020-06-08 NOTE — Assessment & Plan Note (Signed)
Has quit smoking again Continue the tiotropium and prn albuterol

## 2020-06-09 ENCOUNTER — Telehealth: Payer: Self-pay | Admitting: Internal Medicine

## 2020-06-09 LAB — CBC
HCT: 46.6 % (ref 39.0–52.0)
Hemoglobin: 16 g/dL (ref 13.0–17.0)
MCHC: 34.4 g/dL (ref 30.0–36.0)
MCV: 96.2 fl (ref 78.0–100.0)
Platelets: 224 10*3/uL (ref 150.0–400.0)
RBC: 4.84 Mil/uL (ref 4.22–5.81)
RDW: 14 % (ref 11.5–15.5)
WBC: 8.8 10*3/uL (ref 4.0–10.5)

## 2020-06-09 LAB — COMPREHENSIVE METABOLIC PANEL
ALT: 9 U/L (ref 0–53)
AST: 14 U/L (ref 0–37)
Albumin: 4.1 g/dL (ref 3.5–5.2)
Alkaline Phosphatase: 63 U/L (ref 39–117)
BUN: 10 mg/dL (ref 6–23)
CO2: 31 mEq/L (ref 19–32)
Calcium: 9.2 mg/dL (ref 8.4–10.5)
Chloride: 103 mEq/L (ref 96–112)
Creatinine, Ser: 0.84 mg/dL (ref 0.40–1.50)
GFR: 87.98 mL/min (ref >60.00)
Glucose, Bld: 76 mg/dL (ref 70–99)
Potassium: 4.2 mEq/L (ref 3.5–5.1)
Sodium: 139 mEq/L (ref 135–145)
Total Bilirubin: 0.7 mg/dL (ref 0.2–1.2)
Total Protein: 6.3 g/dL (ref 6.0–8.3)

## 2020-06-09 LAB — PSA: PSA: 4.72 ng/mL — ABNORMAL HIGH (ref 0.10–4.00)

## 2020-06-09 NOTE — Telephone Encounter (Signed)
Patient received a call from a [facility] #  No note in the system . EM

## 2020-09-14 ENCOUNTER — Other Ambulatory Visit: Payer: Self-pay | Admitting: Internal Medicine

## 2020-12-10 ENCOUNTER — Other Ambulatory Visit: Payer: Self-pay | Admitting: Internal Medicine

## 2020-12-28 ENCOUNTER — Telehealth: Payer: Self-pay | Admitting: Internal Medicine

## 2020-12-28 MED ORDER — PREDNISONE 20 MG PO TABS
40.0000 mg | ORAL_TABLET | Freq: Every day | ORAL | 0 refills | Status: DC
Start: 2020-12-28 — End: 2021-06-21

## 2020-12-28 NOTE — Telephone Encounter (Signed)
Spoke to wife--he was in the bathroom Is truck driver and started with symptoms about 5 days ago He is like "a limp rag" per wife Slight SOB---but not much different than his usual Able to drink  Seen at urgent care and COVID positive but they wouldn't treat  Discussed that it is too late for antivirals now Doesn't sound ill enough to look into infusion Will send Rx for prednisone given his lung disease They will let me know if he worsens

## 2020-12-28 NOTE — Addendum Note (Signed)
Addended by: Tillman Abide I on: 12/28/2020 05:28 PM   Modules accepted: Orders

## 2020-12-28 NOTE — Telephone Encounter (Signed)
Pt tested positive for Covid today at [facility] and they told him that he needed to contact his primary in order to have paxlovid called in

## 2021-02-11 ENCOUNTER — Other Ambulatory Visit: Payer: Self-pay | Admitting: Internal Medicine

## 2021-02-12 NOTE — Telephone Encounter (Signed)
Spoke with patient to follow up on this refill. Patient states he uses Albuterol inhaler almost every day 2 to 3 times daily, depending on his activity and the weather and uses Spiriva inhaler every morning.  Last time Albuterol refilled was on 12/15/20 with 1 refill.

## 2021-05-03 ENCOUNTER — Other Ambulatory Visit: Payer: Self-pay | Admitting: Internal Medicine

## 2021-05-10 ENCOUNTER — Other Ambulatory Visit: Payer: Self-pay | Admitting: Internal Medicine

## 2021-06-11 ENCOUNTER — Other Ambulatory Visit: Payer: Self-pay | Admitting: Internal Medicine

## 2021-06-21 ENCOUNTER — Encounter: Payer: Self-pay | Admitting: Internal Medicine

## 2021-06-21 ENCOUNTER — Ambulatory Visit (INDEPENDENT_AMBULATORY_CARE_PROVIDER_SITE_OTHER): Payer: Managed Care, Other (non HMO) | Admitting: Internal Medicine

## 2021-06-21 ENCOUNTER — Other Ambulatory Visit: Payer: Self-pay

## 2021-06-21 VITALS — BP 100/68 | HR 96 | Temp 97.6°F | Ht 72.0 in | Wt 140.0 lb

## 2021-06-21 DIAGNOSIS — Z7189 Other specified counseling: Secondary | ICD-10-CM

## 2021-06-21 DIAGNOSIS — Z0000 Encounter for general adult medical examination without abnormal findings: Secondary | ICD-10-CM

## 2021-06-21 DIAGNOSIS — Z23 Encounter for immunization: Secondary | ICD-10-CM

## 2021-06-21 DIAGNOSIS — B356 Tinea cruris: Secondary | ICD-10-CM

## 2021-06-21 DIAGNOSIS — Z1211 Encounter for screening for malignant neoplasm of colon: Secondary | ICD-10-CM

## 2021-06-21 DIAGNOSIS — R972 Elevated prostate specific antigen [PSA]: Secondary | ICD-10-CM

## 2021-06-21 DIAGNOSIS — J439 Emphysema, unspecified: Secondary | ICD-10-CM

## 2021-06-21 NOTE — Assessment & Plan Note (Signed)
See social history 

## 2021-06-21 NOTE — Assessment & Plan Note (Signed)
Stable DOE On the tiotropium--- 2 puffs daily

## 2021-06-21 NOTE — Assessment & Plan Note (Addendum)
Healthy Needs to do some exercise Needs COVID booster (has had the first 2) Flu vaccine today FIT--will try again for this Discussed lung cancer screening--he prefers not

## 2021-06-21 NOTE — Assessment & Plan Note (Signed)
Triamcinolone cream works for flares

## 2021-06-21 NOTE — Progress Notes (Signed)
Subjective:    Patient ID: Eugene Patel, male    DOB: 12-May-1951, 71 y.o.   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  Breathing is stable "No better--no worse" Still gets DOE if does anything physical No regular exercise  Current Outpatient Medications on File Prior to Visit  Medication Sig Dispense Refill   albuterol (VENTOLIN HFA) 108 (90 Base) MCG/ACT inhaler INHALE 2 PUFFS INTO THE LUNGS 3 TIMES DAILY AS NEEDED. 18 g 0   SPIRIVA HANDIHALER 18 MCG inhalation capsule PLACE ONE CAPSULE INTO THE INHALER AND INHALE DAILY 30 capsule 11   triamcinolone cream (KENALOG) 0.1 % APPLY TOPICALLY 2 TIMES DAILY. 45 g 3   No current facility-administered medications on file prior to visit.    No Known Allergies  History reviewed. No pertinent past medical history.  Past Surgical History:  Procedure Laterality Date   CHOLECYSTECTOMY  2010    Family History  Problem Relation Age of Onset   Diabetes Father    Diabetes Other    Cancer Neg Hx    Heart disease Neg Hx    Hypertension Neg Hx     Social History   Socioeconomic History   Marital status: Married    Spouse name: Not on file   Number of children: 2   Years of education: Not on file   Highest education level: Not on file  Occupational History   Occupation: Truck driver    Comment: distance driving  Tobacco Use   Smoking status: Former    Types: Cigarettes    Quit date: 06/14/2019    Years since quitting: 2.0   Smokeless tobacco: Never  Substance and Sexual Activity   Alcohol use: No    Alcohol/week: 0.0 standard drinks    Comment: Exposed to 2nd hand smoke   Drug use: No   Sexual activity: Not on file  Other Topics Concern   Not on file  Social History Narrative   No living will   Requests wife make decisions for him, alternate is daughter Caren Griffins   Would accept resuscitation but no prolonged ventilation   Probably wouldn't want  tube feeds if cognitively unaware   Social Determinants of Health   Financial Resource Strain: Not on file  Food Insecurity: Not on file  Transportation Needs: Not on file  Physical Activity: Not on file  Stress: Not on file  Social Connections: Not on file  Intimate Partner Violence: Not on file   Review of Systems  Constitutional:  Negative for unexpected weight change.       Weight is up a couple of pounds Wears seat belt  HENT:  Positive for tinnitus. Negative for hearing loss.        Full dentures--fit fine  Eyes:  Negative for visual disturbance.       No diplopia or unilateral vision loss  Respiratory:  Positive for shortness of breath. Negative for cough and wheezing.   Cardiovascular:  Negative for chest pain, palpitations and leg swelling.  Gastrointestinal:  Negative for blood in stool and constipation.       No heartburn  Endocrine: Negative for polydipsia and polyuria.  Genitourinary:  Negative for difficulty urinating and urgency.       No sex--no problem  Musculoskeletal:  Negative for arthralgias, back pain and joint swelling.  Skin:        No suspicious lesions Left rash is cleared with  the cream  Allergic/Immunologic: Negative for environmental allergies and immunocompromised state.  Neurological:  Negative for dizziness, syncope, light-headedness and headaches.  Hematological:  Negative for adenopathy. Bruises/bleeds easily.  Psychiatric/Behavioral:  Negative for dysphoric mood and sleep disturbance. The patient is not nervous/anxious.       Objective:   Physical Exam Constitutional:      Appearance: Normal appearance.  HENT:     Mouth/Throat:     Pharynx: No oropharyngeal exudate or posterior oropharyngeal erythema.  Eyes:     Conjunctiva/sclera: Conjunctivae normal.     Pupils: Pupils are equal, round, and reactive to light.  Cardiovascular:     Rate and Rhythm: Normal rate and regular rhythm.     Pulses: Normal pulses.     Heart sounds: No  murmur heard.   No gallop.  Pulmonary:     Effort: Pulmonary effort is normal.     Breath sounds: No wheezing or rales.     Comments: Decreased breath sounds but clear Abdominal:     Palpations: Abdomen is soft.     Tenderness: There is no abdominal tenderness.  Musculoskeletal:     Cervical back: Neck supple.     Right lower leg: No edema.     Left lower leg: No edema.  Lymphadenopathy:     Cervical: No cervical adenopathy.  Skin:    Findings: No lesion or rash.  Neurological:     General: No focal deficit present.     Mental Status: He is alert and oriented to person, place, and time.  Psychiatric:        Mood and Affect: Mood normal.        Behavior: Behavior normal.           Assessment & Plan:

## 2021-06-21 NOTE — Progress Notes (Signed)
Hearing Screening - Comments:: July 2022 for DOT Physical Vision Screening - Comments:: July 2022 for DOT Physical

## 2021-06-21 NOTE — Assessment & Plan Note (Signed)
Will check PSA again this year If stays down, can consider stopping the testing

## 2021-06-22 LAB — COMPREHENSIVE METABOLIC PANEL
ALT: 12 U/L (ref 0–53)
AST: 17 U/L (ref 0–37)
Albumin: 4 g/dL (ref 3.5–5.2)
Alkaline Phosphatase: 75 U/L (ref 39–117)
BUN: 11 mg/dL (ref 6–23)
CO2: 30 mEq/L (ref 19–32)
Calcium: 9 mg/dL (ref 8.4–10.5)
Chloride: 102 mEq/L (ref 96–112)
Creatinine, Ser: 0.87 mg/dL (ref 0.40–1.50)
GFR: 86.42 mL/min (ref >60.00)
Glucose, Bld: 71 mg/dL (ref 70–99)
Potassium: 4.1 mEq/L (ref 3.5–5.1)
Sodium: 137 mEq/L (ref 135–145)
Total Bilirubin: 0.5 mg/dL (ref 0.2–1.2)
Total Protein: 6.3 g/dL (ref 6.0–8.3)

## 2021-06-22 LAB — CBC
HCT: 47.6 % (ref 39.0–52.0)
Hemoglobin: 15.8 g/dL (ref 13.0–17.0)
MCHC: 33.1 g/dL (ref 30.0–36.0)
MCV: 95.5 fl (ref 78.0–100.0)
Platelets: 239 10*3/uL (ref 150.0–400.0)
RBC: 4.99 Mil/uL (ref 4.22–5.81)
RDW: 13.7 % (ref 11.5–15.5)
WBC: 7 10*3/uL (ref 4.0–10.5)

## 2021-06-22 LAB — PSA: PSA: 3.78 ng/mL (ref 0.10–4.00)

## 2021-06-24 ENCOUNTER — Other Ambulatory Visit (INDEPENDENT_AMBULATORY_CARE_PROVIDER_SITE_OTHER): Payer: Managed Care, Other (non HMO)

## 2021-06-24 DIAGNOSIS — Z1211 Encounter for screening for malignant neoplasm of colon: Secondary | ICD-10-CM

## 2021-06-24 LAB — FECAL OCCULT BLOOD, IMMUNOCHEMICAL: Fecal Occult Bld: NEGATIVE

## 2021-07-13 ENCOUNTER — Other Ambulatory Visit: Payer: Self-pay | Admitting: Internal Medicine

## 2021-07-14 ENCOUNTER — Other Ambulatory Visit: Payer: Self-pay | Admitting: Internal Medicine

## 2021-07-14 ENCOUNTER — Telehealth: Payer: Self-pay | Admitting: Internal Medicine

## 2021-07-14 MED ORDER — ALBUTEROL SULFATE HFA 108 (90 BASE) MCG/ACT IN AERS: INHALATION_SPRAY | RESPIRATORY_TRACT | 1 refills | Status: DC

## 2021-07-14 MED ORDER — BUDESONIDE-FORMOTEROL FUMARATE 160-4.5 MCG/ACT IN AERO: 2.0000 | INHALATION_SPRAY | Freq: Two times a day (BID) | RESPIRATORY_TRACT | 3 refills | Status: DC

## 2021-07-14 NOTE — Telephone Encounter (Signed)
Inbound call from [facility] w/[facility] requesting clarification on the denial of his Albuterol Inhaler refill request.  The last rx was written 06/11/21 & picked up bythe pt on 06/14/21, so he is due; however each attempt to send for electronic approval has been denied.  Please advise by calling back @ [phone].  Thank you

## 2021-07-14 NOTE — Telephone Encounter (Signed)
Spoke to [place] at pharmacy. She said he has been filling it monthly.  Called and spoke to pt to see how often he is using the albuterol inhaler. He said he is taking 2 puffs up to 6 and 7 times daily. 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 Said he uses his Spiriva in the morning. I advised that he did not need to be using the albuterol as much as he is and will most likely need to make an appt with Dr Alphonsus Sias to discuss other treatments. Will forward to Dr Alphonsus Sias to authorize albuterol refill and to let us know if he needs to be seen.

## 2021-07-14 NOTE — Telephone Encounter (Signed)
Spoke to pt. Advised him of Dr Alla German note. He agrees to try the Symbicort and made him and appt in March to follow-up.

## 2021-08-30 ENCOUNTER — Encounter: Payer: Self-pay | Admitting: Internal Medicine

## 2021-08-30 ENCOUNTER — Other Ambulatory Visit: Payer: Self-pay

## 2021-08-30 ENCOUNTER — Ambulatory Visit (INDEPENDENT_AMBULATORY_CARE_PROVIDER_SITE_OTHER): Payer: Managed Care, Other (non HMO) | Admitting: Internal Medicine

## 2021-08-30 VITALS — BP 108/80 | HR 74 | Temp 96.3°F | Ht 72.0 in | Wt 140.0 lb

## 2021-08-30 DIAGNOSIS — J439 Emphysema, unspecified: Secondary | ICD-10-CM

## 2021-08-30 NOTE — Progress Notes (Signed)
? ?  Subjective:  ? ? Patient ID: Eugene Patel, male    DOB: 16-Mar-1951, 71 y.o.   MRN: [id number] ? ?HPI ?Here for follow up of COPD and new inhaler ? ?Has been using the symbicort twice a day ?Does rinse after ?He doesn't notice any difference from the spiriva ? 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 ?He is satisfied with how things are ? ?Current Outpatient Medications on File Prior to Visit  ?Medication Sig Dispense Refill  ? albuterol (VENTOLIN HFA) 108 (90 Base) MCG/ACT inhaler INHALE 2 PUFFS INTO THE LUNGS 3 TIMES DAILY AS NEEDED. 18 g 1  ? budesonide-formoterol (SYMBICORT) 160-4.5 MCG/ACT inhaler Inhale 2 puffs into the lungs 2 (two) times daily. 10.2 g 3  ? triamcinolone cream (KENALOG) 0.1 % APPLY TOPICALLY 2 TIMES DAILY. 45 g 3  ? ?No current facility-administered medications on file prior to visit.  ? ? ?No Known Allergies ? ?History reviewed. No pertinent past medical history. ? ?Past Surgical History:  ?Procedure Laterality Date  ? CHOLECYSTECTOMY  2010  ? ? ?Family History  ?Problem Relation Age of Onset  ? Diabetes Father   ? Diabetes Other   ? Cancer Neg Hx   ? Heart disease Neg Hx   ? Hypertension Neg Hx   ? ? ?Social History  ? ?Socioeconomic History  ? Marital status: Married  ?  Spouse name: Not on file  ? Number of children: 2  ? Years of education: Not on file  ? Highest education level: Not on file  ?Occupational History  ? Occupation: Truck Hospital doctor  ?  Comment: distance driving  ?Tobacco Use  ? Smoking status: Former  ?  Types: Cigarettes  ?  Quit date: 06/14/2019  ?  Years since quitting: 2.2  ?  Passive exposure: Current  ? Smokeless tobacco: Never  ?Substance and Sexual Activity  ? Alcohol use: No  ?  Alcohol/week: 0.0 standard drinks  ?  Comment: Exposed to 2nd hand smoke  ? Drug use: No  ?  Sexual activity: Not on file  ?Other Topics Concern  ? Not on file  ?Social History Narrative  ? No living will  ? Requests wife make decisions for him, alternate is daughter Aram Beecham  ? Would accept resuscitation but no prolonged ventilation  ? Probably wouldn't want tube feeds if cognitively unaware  ? ?Social Determinants of Health  ? ?Financial Resource Strain: Not on file  ?Food Insecurity: Not on file  ?Transportation Needs: Not on file  ?Physical Activity: Not on file  ?Stress: Not on file  ?Social Connections: Not on file  ?Intimate Partner Violence: Not on file  ? ?Review of Systems ?Sleeps okay ?Some cough--not as bad as with recent infection and apparent exacerbation (treated with nebs/IM steroids at urgent care) ? ? ?   ?Objective:  ? Physical Exam ?Constitutional:   ?   Appearance: Normal appearance.  ?Pulmonary:  ?   Effort: Pulmonary effort is normal.  ?   Breath sounds: No wheezing or rales.  ?   Comments: Decreased breath sounds but clear ?Neurological:  ?   Mental Status: He is alert.  ?  ? ? ? ? ?   ?Assessment & Plan:  ? ?

## 2021-08-30 NOTE — Assessment & Plan Note (Signed)
Had exacerbation just about the time he changed to symbicort bid ?Still gets DOE with his work and needs the albuterol ?Will set up with pulmonary to evaluate further ?

## 2021-09-07 ENCOUNTER — Other Ambulatory Visit: Payer: Self-pay | Admitting: Internal Medicine

## 2021-10-05 ENCOUNTER — Other Ambulatory Visit: Payer: Self-pay | Admitting: Internal Medicine

## 2021-10-25 ENCOUNTER — Encounter: Payer: Self-pay | Admitting: Pulmonary Disease

## 2021-10-25 ENCOUNTER — Ambulatory Visit (INDEPENDENT_AMBULATORY_CARE_PROVIDER_SITE_OTHER): Payer: Managed Care, Other (non HMO) | Admitting: Pulmonary Disease

## 2021-10-25 ENCOUNTER — Ambulatory Visit
Admission: RE | Admit: 2021-10-25 | Discharge: 2021-10-25 | Disposition: A | Discharge status: Home / Self Care | Payer: Managed Care, Other (non HMO) | Attending: Pulmonary Disease | Admitting: Pulmonary Disease

## 2021-10-25 ENCOUNTER — Ambulatory Visit
Admission: RE | Admit: 2021-10-25 | Discharge: 2021-10-25 | Disposition: A | Discharge status: Home / Self Care | Payer: Managed Care, Other (non HMO) | Source: Ambulatory Visit | Attending: Pulmonary Disease | Admitting: Pulmonary Disease

## 2021-10-25 VITALS — BP 122/60 | HR 87 | Temp 97.9°F | Ht 72.0 in | Wt 138.8 lb

## 2021-10-25 DIAGNOSIS — J449 Chronic obstructive pulmonary disease, unspecified: Secondary | ICD-10-CM

## 2021-10-25 DIAGNOSIS — R490 Dysphonia: Secondary | ICD-10-CM | POA: Diagnosis not present

## 2021-10-25 DIAGNOSIS — R0602 Shortness of breath: Secondary | ICD-10-CM

## 2021-10-25 DIAGNOSIS — Z87891 Personal history of nicotine dependence: Secondary | ICD-10-CM

## 2021-10-25 MED ORDER — BREZTRI AEROSPHERE 160-9-4.8 MCG/ACT IN AERO: 2.0000 | INHALATION_SPRAY | Freq: Two times a day (BID) | RESPIRATORY_TRACT | 0 refills | Status: DC

## 2021-10-25 MED ORDER — METHYLPREDNISOLONE 4 MG PO TBPK: ORAL_TABLET | ORAL | 0 refills | Status: DC

## 2021-10-25 NOTE — Patient Instructions (Signed)
We have changed your inhaler to 1 called Breztri this is 2 puffs twice a day.  Make sure you rinse your mouth well after you use it.  Let us know how you do with this inhaler so we can send the prescription into your pharmacy. ? ?We are going to get a chest x-ray today. ? ?We are scheduling you for breathing tests this will tell us about the severity of your COPD. ? ?You will also be scheduled at a later date for a CT scan through the lung cancer screening program. ? ?I have sent a prescription to your pharmacy for a little bit of a longer therapy of prednisone like medication to see if this helps your breathing. ? ?We will see you in follow-up in 2 to 3 months time call sooner should any new problems arise. ?

## 2021-10-25 NOTE — Progress Notes (Signed)
Subjective:    Patient ID: Eugene Patel, male    DOB: [birth year], 71 y.o.   MRN: [id number] Patient Care Team: Karie Schwalbe, MD as PCP - General (Pediatrics)  Chief Complaint  Patient presents with   pulmonary consult    Hx of COPD. SOB with exertion, prod cough with clear sputum and wheezing.     HPI Eugene Patel is a 71 year old former smoker (quit 07/03/2019, 56 PY) presents for evaluation of shortness of breath present for "many years" worse since around February 1.  He is kindly referred by Dr. Tillman Abide.  The patient notes that he has had dyspnea on exertion for many years however over the last several months since around 1 February he has noted worsening of the symptom and now it occurs even at rest.  He notes that nothing seems to relieve it.  Previously he would note some improvement after use of albuterol rescue inhaler but since February has noted that this does not seem to help anymore.  He had been on Spiriva for many years but around December/January he was switched to Symbicort 160/4.5, 2 inhalations twice a day but Spiriva was discontinued.  He has not noticed significant change with the Symbicort.  He has not had any fevers, chills or sweats.  He did have what appeared to have been an exacerbation around the first part of February he was treated with steroids and an antibiotic however this did not improve his symptoms.  He does note that in the past whenever he had an exacerbation this would usually help his symptoms.  He is still actively employed as a Naval architect.  He does not endorse any chest pain, orthopnea or paroxysmal nocturnal dyspnea.  No lower extremity edema.  He has chronic morning cough productive of clear to whitish sputum, he notes wheezing on occasion.  Review of Systems A 10 point review of systems was performed and it is as noted above otherwise negative.  Past Medical History:  Diagnosis Date   Emphysema lung [facility])    Patient Active  Problem List   Diagnosis Date Noted   Cigarette smoker 06/03/2019   Advance directive discussed with patient 06/03/2019   Elevated PSA 01/29/2018   Tinea cruris 01/23/2017   COPD (chronic obstructive pulmonary disease) with emphysema (HCC) 02/09/2015   Routine general medical examination at a health care facility 12/19/2012   Psoriasis 12/19/2012   Past Surgical History:  Procedure Laterality Date   CHOLECYSTECTOMY  2010   Family History  Problem Relation Age of Onset   Diabetes Father    Diabetes Other    Cancer Neg Hx    Heart disease Neg Hx    Hypertension Neg Hx    Social History   Tobacco Use   Smoking status: Former    Packs/day: 1.50    Years: 52.00    Pack years: 78.00    Types: Cigarettes    Quit date: 06/14/2019    Years since quitting: 2.3    Passive exposure: Current   Smokeless tobacco: Never  Substance Use Topics   Alcohol use: No    Alcohol/week: 0.0 standard drinks    Comment: Exposed to 2nd hand smoke   No Known Allergies  Current Meds  Medication Sig   albuterol (VENTOLIN HFA) 108 (90 Base) MCG/ACT inhaler INHALE 2 PUFFS INTO THE LUNGS 3 TIMES DAILY AS NEEDED   budesonide-formoterol (SYMBICORT) 160-4.5 MCG/ACT inhaler Inhale 2 puffs into the lungs 2 (two) times daily.  triamcinolone cream (KENALOG) 0.1 % APPLY TOPICALLY 2 TIMES DAILY.   Immunization History  Administered Date(s) Administered   Fluad Quad(high Dose 65+) 06/03/2019, 06/08/2020, 06/21/2021   Influenza,inj,Quad PF,6+ Mos 02/09/2015   Pneumococcal Conjugate-13 02/09/2015   Pneumococcal Polysaccharide-23 01/18/2016   Tdap 12/19/2012       Objective:   Physical Exam BP 122/60 (BP Location: Left Arm, Cuff Size: Normal)   Pulse 87   Temp 97.9 F (36.6 C) (Temporal)   Ht 6' (1.829 m)   Wt 138 lb 12.8 oz (63 kg)   SpO2 96%   BMI 18.82 kg/m  GENERAL: Thin well-developed gentleman respiratory distress.  No use of accessories however sits in tripod position.  No conversational  dyspnea.  Hoarse voice. HEAD: Normocephalic, atraumatic.  EYES: Pupils equal, round, reactive to light.  No scleral icterus.  MOUTH: Wears dentures uppers, lowers. NECK: Supple. No thyromegaly. Trachea midline. No JVD.  No adenopathy. PULMONARY: Distant breath sounds bilaterally.  No adventitious sounds. CARDIOVASCULAR: S1 and S2. Regular rate and rhythm.  No rubs, murmurs or gallops heard. ABDOMEN: Scaphoid, otherwise benign. MUSCULOSKELETAL: No joint deformity, no clubbing, no edema.  NEUROLOGIC: No overt focal deficit, no gait disturbance, speech is fluent. SKIN: Intact,warm,dry. PSYCH: Mood and behavior normal.     Assessment & Plan:     ICD-10-CM   1. Shortness of breath  R06.02 DG Chest 2 View    Pulmonary Function Test [facility] Only   Suspect that this is due to poorly compensated COPD Chest x-ray, PFTs will be ordered    2. COPD with chronic bronchitis and emphysema (HCC)  J44.9 DG Chest 2 View    Pulmonary Function Test [facility] Only   Quantitate by PFTs Trial of Breztri 2 puffs twice a day Continue as needed albuterol    3. Chronic hoarseness  R49.0    Monitor for changes in quality of the voice Rinse well after triple therapy inhaler    4. Former heavy tobacco smoker  Z87.891 Ambulatory Referral for Lung Cancer Scre   Quit 2 years ago No evidence of relapse Referral to lung cancer screening program     Orders Placed This Encounter  Procedures   DG Chest 2 View    Standing Status:   Future    Number of Occurrences:   1    Standing Expiration Date:   04/27/2022    Order Specific Question:   Reason for Exam (SYMPTOM  OR DIAGNOSIS REQUIRED)    Answer:   Dyspnea    Order Specific Question:   Preferred imaging location?    Answer:   [facility]   Ambulatory Referral for Lung Cancer Scre    Referral Priority:   Routine    Referral Type:   Consultation    Referral Reason:   Specialty Services Required    Referred to Provider:   Bevelyn Ngo, NP    Number of  Visits Requested:   1   Pulmonary Function Test [facility] Only    Standing Status:   Future    Standing Expiration Date:   04/27/2022    Order Specific Question:   Full PFT: includes the following: basic spirometry, spirometry pre & post bronchodilator, diffusion capacity (DLCO), lung volumes    Answer:   Full PFT    Order Specific Question:   This test can only be performed at    Answer:   [facility]   Meds ordered this encounter  Medications   Budeson-Glycopyrrol-Formoterol (BREZTRI AEROSPHERE) 160-9-4.8 MCG/ACT AERO  Sig: Inhale 2 puffs into the lungs in the morning and at bedtime.    Dispense:  10.7 g    Refill:  0    Order Specific Question:   Lot Number?    Answer:   [id number]    Order Specific Question:   Expiration Date?    Answer:   04/13/2024    Order Specific Question:   Manufacturer?    Answer:   [employer] [[id number]]    Order Specific Question:   Quantity    Answer:   2   methylPREDNISolone (MEDROL DOSEPAK) 4 MG TBPK tablet    Sig: Take as directed in the package.    Dispense:  21 tablet    Refill:  0   Believe the patient has significant COPD.  Will quantitate with PFTs.  We will give him a trial of Breztri 2 puffs twice a day which is LABA/ICS/LAMA.  He has been instructed to discontinue Symbicort.  He does have active bronchospasm noted today.  Do not believe that he requires more antibiotic at present however we will give him a Medrol taper pack.  We will see him in follow-up in 3 months time he is to contact us prior to that time should any new difficulties arise.  Gailen Shelter. Laura Davinci Glotfelty, MD Advanced Bronchoscopy PCCM [facility]    *This note was dictated using voice recognition software/Dragon.  Despite best efforts to proofread, errors can occur which can change the meaning. Any transcriptional errors that result from this process are unintentional and may not be fully corrected at the time of dictation.

## 2021-10-27 ENCOUNTER — Other Ambulatory Visit: Payer: Self-pay

## 2021-10-27 MED ORDER — BREZTRI AEROSPHERE 160-9-4.8 MCG/ACT IN AERO
2.0000 | INHALATION_SPRAY | Freq: Two times a day (BID) | RESPIRATORY_TRACT | 11 refills | Status: DC
Start: 2021-10-27 — End: 2022-11-10

## 2021-12-01 ENCOUNTER — Other Ambulatory Visit: Payer: Self-pay | Admitting: Internal Medicine

## 2021-12-21 ENCOUNTER — Other Ambulatory Visit: Payer: Self-pay

## 2021-12-21 DIAGNOSIS — Z122 Encounter for screening for malignant neoplasm of respiratory organs: Secondary | ICD-10-CM

## 2021-12-21 DIAGNOSIS — Z87891 Personal history of nicotine dependence: Secondary | ICD-10-CM

## 2021-12-27 ENCOUNTER — Ambulatory Visit (INDEPENDENT_AMBULATORY_CARE_PROVIDER_SITE_OTHER): Payer: Managed Care, Other (non HMO) | Admitting: Adult Health

## 2021-12-27 ENCOUNTER — Encounter: Payer: Self-pay | Admitting: Adult Health

## 2021-12-27 DIAGNOSIS — J439 Emphysema, unspecified: Secondary | ICD-10-CM | POA: Diagnosis not present

## 2021-12-27 NOTE — Patient Instructions (Addendum)
Continue on BREZTRI 2 puffs Twice daily  , rinse after use  Albuterol inhaler As needed .  Activity as tolerated  PFTs  CT scan later this month as planned  Follow up with in Dr. Jayme Cloud in 6 months and As needed

## 2021-12-27 NOTE — Assessment & Plan Note (Signed)
Presumed COPD , PFT pending  LDCT screening pending  Improved symptom control on BREZTRI   Plan  Patient Instructions  Continue on BREZTRI 2 puffs Twice daily  , rinse after use  Albuterol inhaler As needed .  Activity as tolerated  PFTs  CT scan later this month as planned  Follow up with in Dr. Jayme Cloud in 6 months and As needed

## 2021-12-27 NOTE — Progress Notes (Signed)
@Patient  ID: Eugene Patel, male    DOB: July 07, 1950, 71 y.o.   MRN: [id number]  Chief Complaint  Patient presents with   Follow-up    Referring provider: Venia Carbon, MD  HPI: 71 year old male former smoker with heavy smoking history seen for pulmonary consult Oct 25, 2021 For shortness of breath, COPD  TEST/EVENTS :   12/27/2021 Follow up : COPD  Patient presents for a 80-monthfollow-up.  Patient was seen last visit for pulmonary consult for shortness of breath and to establish for COPD.  Patient has a heavy smoking history.  78-year pack history.  Patient has been having progressive shortness of breath and decreased activity tolerance last visit.  He had been on Symbicort.  He was changed over to [facility] [employer] daily.  Chest x-ray showed emphysematous changes without acute process.  He was set up for pulmonary function testing which is still pending.  And he was referred to the lung cancer screening program. Has CT planned end of month  Since last visit patient is feeling some better. Feels BJudithann Saugeris working better.  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  Walk test last visit normal on room air. No significant desats.  Gets winded with heavy activity. No flare of cough or wheezing. Uses albuterol inhaler at least 2 x a day weight is stable . No hemoptysis  .    No Known Allergies  Immunization History  Administered Date(s) Administered   Fluad Quad(high Dose 65+) 06/03/2019, 06/08/2020, 06/21/2021   Influenza,inj,Quad PF,6+ Mos 02/09/2015   Pneumococcal Conjugate-13 02/09/2015   Pneumococcal Polysaccharide-23 01/18/2016   Tdap 12/19/2012    Past Medical History:  Diagnosis Date   [place] [facility] [place]     Tobacco History: Social History   Tobacco Use  Smoking Status Former   Packs/day: 1.50   Years: 52.00   Total pack years: 78.00   Types: Cigarettes   Quit date: 06/14/2019   Years since quitting: 2.5    Passive exposure: Current  Smokeless Tobacco Never   Counseling given: Not Answered   Outpatient Medications Prior to Visit  Medication Sig Dispense Refill   albuterol (VENTOLIN HFA) 108 (90 Base) MCG/ACT inhaler INHALE 2 PUFFS INTO THE LUNGS 3 TIMES DAILY AS NEEDED 18 g 1   Budeson-Glycopyrrol-Formoterol (BREZTRI AEROSPHERE) 160-9-4.8 MCG/ACT AERO Inhale 2 puffs into the lungs in the morning and at bedtime. 10.7 g 11   triamcinolone cream (KENALOG) 0.1 % APPLY TOPICALLY 2 TIMES DAILY. 45 g 3   methylPREDNISolone (MEDROL DOSEPAK) 4 MG TBPK tablet Take as directed in the package. 21 tablet 0   No facility-administered medications prior to visit.     Review of Systems:   Constitutional:   No  weight loss, night sweats,  Fevers, chills, +fatigue, or  lassitude.  HEENT:   No headaches,  Difficulty swallowing,  Tooth/dental problems, or  Sore throat,                No sneezing, itching, ear ache, nasal congestion, post nasal drip,   CV:  No chest pain,  Orthopnea, PND, swelling in lower extremities, anasarca, dizziness, palpitations, syncope.   GI  No heartburn, indigestion, abdominal pain, nausea, vomiting, diarrhea, change in bowel habits, loss of appetite, bloody stools.   Resp:   No chest wall deformity  Skin: no rash or lesions.  GU: no dysuria, change in color of urine, no urgency or frequency.  No flank pain, no  hematuria   MS:  No joint pain or swelling.  No decreased range of motion.  No back pain.    Physical Exam  BP 120/76 (BP Location: Left Arm, Cuff Size: Normal)   Pulse 89   Temp 97.9 F (36.6 C) (Temporal)   Ht 6' (1.829 m)   Wt 144 lb 6.4 oz (65.5 kg)   SpO2 97%   BMI 19.58 kg/m   GEN: A/Ox3; pleasant , NAD, well nourished    HEENT:  /AT,   NOSE-clear, THROAT-clear, no lesions, no postnasal drip or exudate noted.   NECK:  Supple w/ fair ROM; no JVD; normal carotid impulses w/o bruits; no thyromegaly or nodules palpated; no lymphadenopathy.     RESP  Clear  P & A; w/o, wheezes/ rales/ or rhonchi. no accessory muscle use, no dullness to percussion  CARD:  RRR, no m/r/g, no peripheral edema, pulses intact, no cyanosis or clubbing.  GI:   Soft & nt; nml bowel sounds; no organomegaly or masses detected.   Musco: Warm bil, no deformities or joint swelling noted.   Neuro: alert, no focal deficits noted.    Skin: Warm, no lesions or rashes    Lab Results:  CBC    Component Value Date/Time   WBC 7.0 06/21/2021 1503   RBC 4.99 06/21/2021 1503   HGB 15.8 06/21/2021 1503   HCT 47.6 06/21/2021 1503   PLT 239.0 06/21/2021 1503   MCV 95.5 06/21/2021 1503   MCHC 33.1 06/21/2021 1503   RDW 13.7 06/21/2021 1503   LYMPHSABS 1.3 01/23/2017 1132   MONOABS 0.6 01/23/2017 1132   EOSABS 0.1 01/23/2017 1132   BASOSABS 0.1 01/23/2017 1132    BMET    Component Value Date/Time   NA 137 06/21/2021 1503   K 4.1 06/21/2021 1503   CL 102 06/21/2021 1503   CO2 30 06/21/2021 1503   GLUCOSE 71 06/21/2021 1503   BUN 11 06/21/2021 1503   CREATININE 0.87 06/21/2021 1503   CALCIUM 9.0 06/21/2021 1503   GFRNONAA >60 02/21/2009 0424   GFRAA  02/21/2009 0424    >60        The eGFR has been calculated using the MDRD equation. This calculation has not been validated in all clinical situations. eGFR's persistently <60 mL/min signify possible Chronic Kidney Disease.    BNP No results found for: "BNP"  ProBNP No results found for: "PROBNP"  Imaging: No results found.        No data to display          No results found for: "NITRICOXIDE"      Assessment & Plan:   COPD (chronic obstructive pulmonary disease) with emphysema Presumed COPD , PFT pending  LDCT screening pending  Improved symptom control on BREZTRI   Plan  Patient Instructions  Continue on BREZTRI 2 puffs Twice daily  , rinse after use  Albuterol inhaler As needed .  Activity as tolerated  PFTs  CT scan later this month as planned  Follow up  with in Dr. Patsey Berthold in 6 months and As needed           Rexene Edison, NP 12/27/2021

## 2022-01-03 ENCOUNTER — Other Ambulatory Visit: Payer: Self-pay | Admitting: Internal Medicine

## 2022-01-03 NOTE — Progress Notes (Signed)
Agree with the details of the visit as noted by Tammy Parrett, NP.  C. Laura Jolene Guyett, MD  PCCM 

## 2022-01-04 ENCOUNTER — Encounter: Payer: Self-pay | Admitting: Acute Care

## 2022-01-04 ENCOUNTER — Ambulatory Visit: Payer: Managed Care, Other (non HMO) | Admitting: Acute Care

## 2022-01-04 DIAGNOSIS — Z87891 Personal history of nicotine dependence: Secondary | ICD-10-CM | POA: Diagnosis not present

## 2022-01-04 DIAGNOSIS — Z122 Encounter for screening for malignant neoplasm of respiratory organs: Secondary | ICD-10-CM | POA: Diagnosis not present

## 2022-01-04 NOTE — Progress Notes (Signed)
Virtual Visit via Telephone Note  I connected with Eugene Patel on 01/04/22 at 11:30 AM EDT by telephone and verified that I am speaking with the correct person using two identifiers.  Location: Patient: at home Provider: [address], [place], [place], [address]    I discussed the limitations, risks, security and privacy concerns of performing an evaluation and management service by telephone and the availability of in person appointments. I also discussed with the patient that there may be a patient responsible charge related to this service. The patient expressed understanding and agreed to proceed.   Shared Decision Making Visit Lung Cancer Screening Program [phone])   Eligibility: Age 71 y.o. Pack Years Smoking History Calculation 52 pack year smoking history (# packs/per year x # years smoked) Recent History of coughing up blood  no Unexplained weight loss? no ( >Than 15 pounds within the last 6 months ) Prior History Lung / other cancer no (Diagnosis within the last 5 years already requiring surveillance chest CT Scans). Smoking Status Former Smoker Former Smokers: Years since quit: 2 years  Quit Date: 06/2021  Visit Components: Discussion included one or more decision making aids. yes Discussion included risk/benefits of screening. yes Discussion included potential follow up diagnostic testing for abnormal scans. yes Discussion included meaning and risk of over diagnosis. yes Discussion included meaning and risk of False Positives. yes Discussion included meaning of total radiation exposure. yes  Counseling Included: Importance of adherence to annual lung cancer LDCT screening. yes Impact of comorbidities on ability to participate in the program. yes Ability and willingness to under diagnostic treatment. yes  Smoking Cessation Counseling: Current Smokers:  Discussed importance of smoking cessation. yes Information about tobacco cessation classes and  interventions provided to patient. yes Patient provided with "ticket" for LDCT Scan. yes Symptomatic Patient. no  Counseling NA Diagnosis Code: Tobacco Use Z72.0 Asymptomatic Patient yes  Counseling (Intermediate counseling: > three minutes counseling) [id number] Former Smokers:  Discussed the importance of maintaining cigarette abstinence. yes Diagnosis Code: Personal History of Nicotine Dependence. N05.397 Information about tobacco cessation classes and interventions provided to patient. Yes Patient provided with "ticket" for LDCT Scan. yes Written Order for Lung Cancer Screening with LDCT placed in Epic. Yes (CT Chest Lung Cancer Screening Low Dose W/O CM) [id number] Z12.2-Screening of respiratory organs Z87.891-Personal history of nicotine dependence  I spent 25 minutes of face to face time/virtual visit time  with  Eugene Patel discussing the risks and benefits of lung cancer screening. We took the time to pause the power point at intervals to allow for questions to be asked and answered to ensure understanding. We discussed that he had taken the single most powerful action possible to decrease his risk of developing lung cancer when he quit smoking. I counseled him to remain smoke free, and to contact me if he ever had the desire to smoke again so that I can provide resources and tools to help support the effort to remain smoke free. We discussed the time and location of the scan, and that either  Abigail Miyamoto RN, Karlton Lemon, RN or I  or I will call / send a letter with the results within  24-72 hours of receiving them. He has the office contact information in the event he needs to speak with me,  he verbalized understanding of all of the above and had no further questions upon leaving the office.     I explained to the patient that there has been a  high incidence of coronary artery disease noted on these exams. I explained that this is a non-gated exam therefore degree or severity cannot be  determined. This patient is not on statin therapy. I have asked the patient to follow-up with their PCP regarding any incidental finding of coronary artery disease and management with diet or medication as they feel is clinically indicated. The patient verbalized understanding of the above and had no further questions.     Bevelyn Ngo, NP 01/04/2022

## 2022-01-04 NOTE — Patient Instructions (Signed)
Thank you for participating in the [facility] Cancer Screening Program. It was our pleasure to meet you today. We will call you with the results of your scan within the next few days. Your scan will be assigned a Lung RADS category score by the physicians reading the scans.  This Lung RADS score determines follow up scanning.  See below for description of categories, and follow up screening recommendations. We will be in touch to schedule your follow up screening annually or based on recommendations of our providers. We will fax a copy of your scan results to your Primary Care Physician, or the physician who referred you to the program, to ensure they have the results. Please call the office if you have any questions or concerns regarding your scanning experience or results.  Our office number is [phone]. Please speak with [name] Phelps, RN. , or  [name] Buckner RN, They are  our Lung Cancer Screening RN.'s If They are unavailable when you call, Please leave a message on the voice mail. We will return your call at our earliest convenience.This voice mail is monitored several times a day.  Remember, if your scan is normal, we will scan you annually as long as you continue to meet the criteria for the program. (Age 55-77, Current smoker or smoker who has quit within the last 15 years). If you are a smoker, remember, quitting is the single most powerful action that you can take to decrease your risk of lung cancer and other pulmonary, breathing related problems. We know quitting is hard, and we are here to help.  Please let us know if there is anything we can do to help you meet your goal of quitting. If you are a former smoker, congratulations. We are proud of you! Remain smoke free! Remember you can refer friends or family members through the number above.  We will screen them to make sure they meet criteria for the program. Thank you for helping us take better care of you by  participating in Lung Screening.  You can receive free nicotine replacement therapy ( patches, gum or mints) by calling 1-800-QUIT NOW. Please call so we can get you on the path to becoming  a non-smoker. I know it is hard, but you can do this!  Lung RADS Categories:  Lung RADS 1: no nodules or definitely non-concerning nodules.  Recommendation is for a repeat annual scan in 12 months.  Lung RADS 2:  nodules that are non-concerning in appearance and behavior with a very low likelihood of becoming an active cancer. Recommendation is for a repeat annual scan in 12 months.  Lung RADS 3: nodules that are probably non-concerning , includes nodules with a low likelihood of becoming an active cancer.  Recommendation is for a 6-month repeat screening scan. Often noted after an upper respiratory illness. We will be in touch to make sure you have no questions, and to schedule your 6-month scan.  Lung RADS 4 A: nodules with concerning findings, recommendation is most often for a follow up scan in 3 months or additional testing based on our provider's assessment of the scan. We will be in touch to make sure you have no questions and to schedule the recommended 3 month follow up scan.  Lung RADS 4 B:  indicates findings that are concerning. We will be in touch with you to schedule additional diagnostic testing based on our provider's  assessment of the scan.  Other options for assistance in smoking cessation (   As covered by your insurance benefits)  Hypnosis for smoking cessation  [employer] [phone]  Acupuncture for smoking cessation  [facility] [phone]   

## 2022-01-10 ENCOUNTER — Telehealth: Payer: Self-pay | Admitting: Acute Care

## 2022-01-10 ENCOUNTER — Ambulatory Visit
Admission: RE | Admit: 2022-01-10 | Discharge: 2022-01-10 | Disposition: A | Discharge status: Home / Self Care | Payer: Managed Care, Other (non HMO) | Source: Ambulatory Visit | Attending: Internal Medicine | Admitting: Internal Medicine

## 2022-01-10 DIAGNOSIS — Z87891 Personal history of nicotine dependence: Secondary | ICD-10-CM | POA: Diagnosis present

## 2022-01-10 DIAGNOSIS — Z122 Encounter for screening for malignant neoplasm of respiratory organs: Secondary | ICD-10-CM | POA: Insufficient documentation

## 2022-01-10 NOTE — Telephone Encounter (Signed)
Call report:  IMPRESSION: 1. Lung-RADS 4A, suspicious. Follow up low-dose chest CT without contrast in 3 months (please use the following order, "CT CHEST LCS NODULE FOLLOW-UP W/O CM") is recommended. Alternatively, PET may be considered when there is a solid component 10mm or larger. 2. Aortic Atherosclerosis (ICD10-I70.0) and Emphysema (ICD10-J43.9). 3. Coronary artery calcifications. 4. These results will be called to the ordering clinician or representative by the Radiologist Assistant, and communication documented in the PACS or [employer].

## 2022-01-12 NOTE — Telephone Encounter (Signed)
I have attempted to call the patient with the results of their  Low Dose CT Chest Lung cancer screening scan. There was no answer. I have left a HIPPA compliant VM requesting the patient call the office for the scan results. I included the office contact information in the message. We will await his return call. If no return call we will continue to call until patient is contacted.    Angelique Blonder, this patient needs a PET now and then follow up with [name] after it is done. I will order PET once I get in touch with him. I will have [name] make the appointment with [name].

## 2022-01-13 ENCOUNTER — Other Ambulatory Visit: Payer: Self-pay | Admitting: Acute Care

## 2022-01-13 DIAGNOSIS — R911 Solitary pulmonary nodule: Secondary | ICD-10-CM

## 2022-01-13 NOTE — Telephone Encounter (Signed)
Appt scheduled 01/31/2022 at 4:00. Patient is aware and voiced his understanding.  Nothing further needed.

## 2022-01-13 NOTE — Telephone Encounter (Signed)
Patient returned call leaving a voicemail. He can be reached at [phone].

## 2022-01-13 NOTE — Telephone Encounter (Signed)
PET scheduled 01/24/2022.  Dr. Jayme Cloud, where would you like to schedule patient.

## 2022-01-13 NOTE — Telephone Encounter (Signed)
Per Dr. Jayme Cloud verbally- can offer 01/31/2022 at 4:00. Will contact patient once slot has bene opened as requested.

## 2022-01-13 NOTE — Telephone Encounter (Signed)
Mr. Eugene Patel returned my call. I have reviewed the scan results with him . I explained that he has a nodule in his posterior right apex with a mean derived diameter of 14.1 mm. This dominant nodule contains a few scattered areas of calcification. While this may represent sequelae of prior inflammation or infection, this is asymmetric when compared with the left apex.I explained that I have reviewed the scan with Dr. Jayme Cloud, and that she feels we need to do a PET scan now vs waiting 3 months.He will then follow up with Dr. Jayme Cloud to review the scan results and determine plan of care.  He is in agreement with this plan. 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 The number to reach him to schedule is   [phone].  [name], I have placed the PET scan order. Can we keep him on a tickle list so we can make sure if the scan is normal he returns to screening?  [name], can you watch to see when PET is scheduled, and call him to make an appointment to see [name]. His number is above.   Thanks so much all!!

## 2022-01-13 NOTE — Progress Notes (Signed)
Eugene Patel returned my call. I have reviewed the scan results with him . I explained that he has a nodule in his posterior right apex with a mean derived diameter of 14.1 mm. This dominant nodule contains a few scattered areas of calcification. While this may represent sequelae of prior inflammation or infection, this is asymmetric when compared with the left apex.I explained that I have reviewed the scan with Dr. Jayme Cloud, and that she feels we need to do a PET scan now vs waiting 3 months.He will then follow up with Dr. Jayme Cloud to review the scan results and determine plan of care.  He is in agreement with this plan. 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 The number to reach him to schedule is   [phone].

## 2022-01-24 ENCOUNTER — Encounter (HOSPITAL_COMMUNITY): Payer: Managed Care, Other (non HMO)

## 2022-01-24 ENCOUNTER — Telehealth: Payer: Self-pay | Admitting: Pulmonary Disease

## 2022-01-24 NOTE — Telephone Encounter (Signed)
Eugene Patel, can you see where you can reschedule him with Dr. Jayme Cloud?  I do not see anywhere.  Thanks.

## 2022-01-25 NOTE — Telephone Encounter (Signed)
Appt scheduled 02/28/2022 at 4:00. Per Eugene Patel, patient is aware. Nothing further needed.

## 2022-01-25 NOTE — Telephone Encounter (Signed)
Melissa, please schedule.

## 2022-01-25 NOTE — Telephone Encounter (Signed)
Dr. Jayme Cloud, please advise if we can schedule patient for 02/28/2022 at 4:00?

## 2022-01-26 ENCOUNTER — Other Ambulatory Visit: Payer: Self-pay | Admitting: Internal Medicine

## 2022-01-31 ENCOUNTER — Ambulatory Visit: Payer: Managed Care, Other (non HMO) | Admitting: Pulmonary Disease

## 2022-02-21 ENCOUNTER — Ambulatory Visit
Admission: RE | Admit: 2022-02-21 | Discharge: 2022-02-21 | Disposition: A | Discharge status: Home / Self Care | Payer: Managed Care, Other (non HMO) | Source: Ambulatory Visit | Attending: Acute Care | Admitting: Acute Care

## 2022-02-21 DIAGNOSIS — R911 Solitary pulmonary nodule: Secondary | ICD-10-CM | POA: Diagnosis present

## 2022-02-21 LAB — GLUCOSE, CAPILLARY: Glucose-Capillary: 102 mg/dL — ABNORMAL HIGH (ref 70–99)

## 2022-02-21 MED ORDER — FLUDEOXYGLUCOSE F - 18 (FDG) INJECTION
7.5000 | Freq: Once | INTRAVENOUS | Status: AC | PRN
  Administered 2022-02-21: 8.16 via INTRAVENOUS

## 2022-02-28 ENCOUNTER — Encounter: Payer: Self-pay | Admitting: Pulmonary Disease

## 2022-02-28 ENCOUNTER — Ambulatory Visit (INDEPENDENT_AMBULATORY_CARE_PROVIDER_SITE_OTHER): Payer: Managed Care, Other (non HMO) | Admitting: Pulmonary Disease

## 2022-02-28 ENCOUNTER — Ambulatory Visit: Payer: Managed Care, Other (non HMO) | Attending: Pulmonary Disease

## 2022-02-28 VITALS — BP 144/80 | HR 115 | Temp 98.3°F | Ht 72.0 in | Wt 143.8 lb

## 2022-02-28 DIAGNOSIS — R918 Other nonspecific abnormal finding of lung field: Secondary | ICD-10-CM

## 2022-02-28 DIAGNOSIS — R0602 Shortness of breath: Secondary | ICD-10-CM | POA: Diagnosis present

## 2022-02-28 DIAGNOSIS — J449 Chronic obstructive pulmonary disease, unspecified: Secondary | ICD-10-CM

## 2022-02-28 DIAGNOSIS — Z87891 Personal history of nicotine dependence: Secondary | ICD-10-CM

## 2022-02-28 LAB — PULMONARY FUNCTION TEST ARMC ONLY
DL/VA % pred: 38 %
DL/VA: 1.52 ml/min/mmHg/L
DLCO unc % pred: 29 %
DLCO unc: 8.02 ml/min/mmHg
FEF 25-75 Post: 0.42 L/sec
FEF 25-75 Pre: 0.4 L/sec
FEF2575-%Change-Post: 5 %
FEF2575-%Pred-Post: 16 %
FEF2575-%Pred-Pre: 15 %
FEV1-%Change-Post: -13 %
FEV1-%Pred-Post: 20 %
FEV1-%Pred-Pre: 24 %
FEV1-Post: 0.72 L
FEV1-Pre: 0.83 L
FEV1FVC-%Change-Post: -25 %
FEV1FVC-%Pred-Pre: 49 %
FEV6-%Change-Post: 9 %
FEV6-%Pred-Post: 53 %
FEV6-%Pred-Pre: 49 %
FEV6-Post: 2.39 L
FEV6-Pre: 2.19 L
FEV6FVC-%Change-Post: -5 %
FEV6FVC-%Pred-Post: 96 %
FEV6FVC-%Pred-Pre: 101 %
FVC-%Change-Post: 15 %
FVC-%Pred-Post: 56 %
FVC-%Pred-Pre: 48 %
FVC-Post: 2.63 L
FVC-Pre: 2.28 L
Post FEV1/FVC ratio: 27 %
Post FEV6/FVC ratio: 91 %
Pre FEV1/FVC ratio: 36 %
Pre FEV6/FVC Ratio: 96 %
RV % pred: 68 %
RV: 1.77 L
TLC % pred: 66 %
TLC: 4.96 L

## 2022-02-28 MED ORDER — IPRATROPIUM-ALBUTEROL 0.5-2.5 (3) MG/3ML IN SOLN: 3.0000 mL | Freq: Four times a day (QID) | RESPIRATORY_TRACT | 6 refills | Status: DC

## 2022-02-28 MED ORDER — IPRATROPIUM-ALBUTEROL 0.5-2.5 (3) MG/3ML IN SOLN
3.0000 mL | Freq: Once | RESPIRATORY_TRACT | Status: AC
  Administered 2022-02-28: 3 mL via RESPIRATORY_TRACT

## 2022-02-28 NOTE — Patient Instructions (Signed)
We are going to provide you with a nebulizer machine and the medicine for the nebulizer.  You should use the nebulizer 4 times a day.  You may use the Breztri (yellow inhaler) until you get your nebulizer machine.  Once you get your nebulizer machine you do not need to use this inhaler.  You may continue to use your albuterol inhaler as needed.  This is your rescue inhaler.  The findings in your lung are related to scarring, we will repeat a chest CT in 3 months time.  We will see you in follow-up in 6 to 8 weeks time call sooner should any new problems arise.

## 2022-02-28 NOTE — Progress Notes (Deleted)
   Subjective:    Patient ID: Eugene Patel, male    DOB: Apr 18, 1951, 71 y.o.   MRN: [id number]  HPI    Review of Systems     Objective:   Physical Exam        Assessment & Plan:

## 2022-02-28 NOTE — Progress Notes (Signed)
Subjective:    Patient ID: Eugene Patel, male    DOB: Oct 01, 1950, 71 y.o.   MRN: [id number] Patient Care Team: Karie Schwalbe, MD as PCP - General (Pediatrics)  Chief Complaint  Patient presents with   Follow-up    PFT today. Increased SOB.    HPI Patient is a 71 year old former smoker (34 PY) who presents for follow-up on the issue of dyspnea on the basis of COPD.  Patient has been maintained on Breztri 2 puffs twice a day however he feels that this does not help him completely.  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  He had PFTs today that showed that he has very severe COPD with an FEV1 of 0.93 L or 24% predicted.  Recall that the patient had been scheduled for lung cancer screening program, he had this CT performed in July, this showed some areas of concern on the right apex.  Follow-up with PET/CT showed that this area did not have significant uptake and likely represents pleural-parenchymal scarring.  Patient will have a CT repeated in 3 months.  Since his last visit here on 17 July he has not had any new complaint.  No fevers, chills or sweats.  Dyspnea has been relatively unchanged.  There is morning cough but no change in sputum production is usually whitish to pale yellow.  No hemoptysis.  Does not endorse any weight loss or anorexia.  No orthopnea or paroxysmal nocturnal dyspnea.  No sleep disturbance.  No lower extremity edema or calf tenderness.  DATA: 10/25/2021 chest x-ray: Hyperinflation, emphysema noted, blunting of the costophrenic angles likely related to diaphragmatic flattening/scarring. 01/10/2022 chest LDCT: Severe changes of centrilobular and paraseptal emphysema, no pleural effusion, multiple lung nodules noted.  Subpleural lung nodule in the right apex measuring 14.1 mm grade pleural-parenchymal scarring.  Multiple liver cysts. 02/21/2022 PET/CT: Area of concern in the right lung apex is favored to  represent asymmetric pleural-parenchymal scarring.  59-month follow-up with chest CT is suggested. 02/28/2022 PFT: FEV1 0.83 L or 24% predicted, FVC 2.28 L or 48% predicted, FEV1/FVC 36%, there is significant bronchodilator response with regards to FVC indicating reduced air trapping postbronchodilator.  Lung volumes normalized after bronchodilator.  There is air trapping noted.  There is significant airway resistance which also response to bronchodilators.  Diffusion capacity severely reduced.   Review of Systems A 10 point review of systems was performed and it is as noted above otherwise negative.  Patient Active Problem List   Diagnosis Date Noted   Cigarette smoker 06/03/2019   Advance directive discussed with patient 06/03/2019   Elevated PSA 01/29/2018   Tinea cruris 01/23/2017   COPD (chronic obstructive pulmonary disease) with emphysema (HCC) 02/09/2015   Routine general medical examination at a health care facility 12/19/2012   Psoriasis 12/19/2012   Social History   Tobacco Use   Smoking status: Former    Packs/day: 1.50    Years: 52.00    Total pack years: 78.00    Types: Cigarettes    Quit date: 07/14/2020    Years since quitting: 1.6    Passive exposure: Current   Smokeless tobacco: Never  Substance Use Topics   Alcohol use: No    Alcohol/week: 0.0 standard drinks of alcohol    Comment: Exposed to 2nd hand smoke  Wife states that he has been "sneaking" cigarettes.   No Known Allergies  Current Meds  Medication Sig   albuterol (VENTOLIN  HFA) 108 (90 Base) MCG/ACT inhaler INHALE 2 PUFFS INTO THE LUNGS 3 TIMES DAILY AS NEEDED   Budeson-Glycopyrrol-Formoterol (BREZTRI AEROSPHERE) 160-9-4.8 MCG/ACT AERO Inhale 2 puffs into the lungs in the morning and at bedtime.   triamcinolone cream (KENALOG) 0.1 % APPLY TOPICALLY TWICE DAILY AS NEEDED.   Immunization History  Administered Date(s) Administered   Fluad Quad(high Dose 65+) 06/03/2019, 06/08/2020, 06/21/2021    Influenza,inj,Quad PF,6+ Mos 02/09/2015   Pneumococcal Conjugate-13 02/09/2015   Pneumococcal Polysaccharide-23 01/18/2016   Tdap 12/19/2012      Objective:   Physical Exam BP (!) 144/80 (BP Location: Left Arm, Cuff Size: Normal)   Pulse (!) 115   Temp 98.3 F (36.8 C) (Temporal)   Ht 6' (1.829 m)   Wt 143 lb 12.8 oz (65.2 kg)   SpO2 94%   BMI 19.50 kg/m  GENERAL: Thin well-developed gentleman respiratory distress.  No use of accessories however sits in tripod position.  No conversational dyspnea.  Hoarse voice. HEAD: Normocephalic, atraumatic.  EYES: Pupils equal, round, reactive to light.  No scleral icterus.  MOUTH: Wears dentures uppers, lowers.  Oral mucosa moist, no thrush. NECK: Supple. No thyromegaly. Trachea midline. No JVD.  No adenopathy. PULMONARY: Distant breath sounds bilaterally.  Poor air movement.  No adventitious sounds. CARDIOVASCULAR: S1 and S2. Regular rate and rhythm.  No rubs, murmurs or gallops heard. ABDOMEN: Scaphoid, otherwise benign. MUSCULOSKELETAL: No joint deformity, no clubbing, no edema.  NEUROLOGIC: No overt focal deficit, no gait disturbance, speech is fluent. SKIN: Intact,warm,dry. PSYCH: Mood and behavior normal.  Representative image from the CT chest performed 10 January 2022 through the lung cancer screening program, independently reviewed:    Representative image from PET/CT performed 21 February 2022, showing very little to no activity on the process noted on CT chest:   The above findings likely represent pleural-parenchymal scarring.  Patient will need follow-up CT in 3 months.   Patient received nebulization treatment with DuoNeb while in the office, this helped him symptomatically with regards to dyspnea.  Felt that he could breathe better after the nebulization treatment.  Lung examination post nebulization treatment showed better air movement.    Assessment & Plan:     ICD-10-CM   1. Stage 4 very severe COPD by GOLD  classification (HCC)  J44.9 AMB REFERRAL FOR DME    ipratropium-albuterol (DUONEB) 0.5-2.5 (3) MG/3ML nebulizer solution 3 mL   Patient does not have breath-holding capacity to use MDI effectively We will switch to DuoNeb via nebulizer 4 times a day Discontinue Breztri  once on DuoNeb    2. Abnormal finding on lung imaging  R91.8 CT CHEST WO CONTRAST   Appears to be an area of pleural-parenchymal scarring Repeat CT chest 3 months    3. Former cigarette smoker  Z87.891    Has had occasional lapses but for the most part abstinent Counseled with regards to complete abstinence from tobacco     Orders Placed This Encounter  Procedures   CT CHEST WO CONTRAST    Standing Status:   Future    Standing Expiration Date:   03/01/2023    Scheduling Instructions:     39mo    Order Specific Question:   Preferred imaging location?    Answer:   [facility]   AMB REFERRAL FOR DME    Referral Priority:   Routine    Referral Type:   Durable Medical Equipment Purchase    Number of Visits Requested:   1   Meds ordered  this encounter  Medications   ipratropium-albuterol (DUONEB) 0.5-2.5 (3) MG/3ML SOLN    Sig: Take 3 mLs by nebulization 4 (four) times daily.    Dispense:  360 mL    Refill:  6    J44.9   ipratropium-albuterol (DUONEB) 0.5-2.5 (3) MG/3ML nebulizer solution 3 mL   See the patient in follow-up in 6 to 8 weeks time he is to contact us prior to that time should any new difficulties arise.   Renold Don, MD Advanced Bronchoscopy PCCM [facility]    *This note was dictated using voice recognition software/Dragon.  Despite best efforts to proofread, errors can occur which can change the meaning. Any transcriptional errors that result from this process are unintentional and may not be fully corrected at the time of dictation.

## 2022-04-01 ENCOUNTER — Ambulatory Visit (INDEPENDENT_AMBULATORY_CARE_PROVIDER_SITE_OTHER): Payer: Managed Care, Other (non HMO) | Admitting: Internal Medicine

## 2022-04-01 ENCOUNTER — Encounter: Payer: Self-pay | Admitting: Internal Medicine

## 2022-04-01 DIAGNOSIS — J439 Emphysema, unspecified: Secondary | ICD-10-CM | POA: Diagnosis not present

## 2022-04-01 DIAGNOSIS — U071 COVID-19: Secondary | ICD-10-CM | POA: Insufficient documentation

## 2022-04-01 LAB — POC COVID19 BINAXNOW: SARS Coronavirus 2 Ag: POSITIVE — AB

## 2022-04-01 MED ORDER — NIRMATRELVIR/RITONAVIR (PAXLOVID)TABLET: 3.0000 | ORAL_TABLET | Freq: Two times a day (BID) | ORAL | 0 refills | Status: AC

## 2022-04-01 MED ORDER — PREDNISONE 20 MG PO TABS: 40.0000 mg | ORAL_TABLET | Freq: Every day | ORAL | 0 refills | Status: DC

## 2022-04-01 NOTE — Addendum Note (Signed)
Addended by: Pilar Grammes on: 04/01/2022 12:26 PM   Modules accepted: Orders

## 2022-04-01 NOTE — Assessment & Plan Note (Signed)
Mild exacerbation Will continue inhalers/nebs Prednisone burst 40 x 3, then 20 x 3

## 2022-04-01 NOTE — Progress Notes (Signed)
Subjective:    Patient ID: Eugene Patel, male    DOB: [birth year], 71 y.o.   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 10.7 g 11   ipratropium-albuterol (DUONEB) 0.5-2.5 (3) MG/3ML SOLN Take 3 mLs by nebulization 4 (four) times daily. 360 mL 6   triamcinolone cream (KENALOG) 0.1 % APPLY TOPICALLY TWICE DAILY AS NEEDED. 45 g 3   No current facility-administered medications on file prior to visit.    No Known Allergies  Past Medical History:  Diagnosis Date   Emphysema lung ([place])     Past Surgical History:  Procedure Laterality Date   CHOLECYSTECTOMY  2010    Family History  Problem Relation Age of Onset   Diabetes Father    Diabetes Other    Cancer Neg Hx    Heart disease Neg Hx    Hypertension Neg Hx     Social History   Socioeconomic History   Marital status: Married    Spouse name: Not on file   Number of children: 2   Years of education: Not on file   Highest education level: Not on file  Occupational History   Occupation: Truck driver    Comment: distance driving  Tobacco Use   Smoking status: Former    Packs/day: 1.50    Years: 52.00    Total pack years: 78.00    Types: Cigarettes    Quit date: 07/14/2020    Years  since quitting: 1.7    Passive exposure: Current   Smokeless tobacco: Never  Substance and Sexual Activity   Alcohol use: No    Alcohol/week: 0.0 standard drinks of alcohol    Comment: Exposed to 2nd hand smoke   Drug use: No   Sexual activity: Not on file  Other Topics Concern   Not on file  Social History Narrative   No living will   Requests wife make decisions for him, alternate is daughter Caren Griffins   Would accept resuscitation but no prolonged ventilation   Probably wouldn't want tube feeds if cognitively unaware   Social Determinants of Health   Financial Resource Strain: Not on file  Food Insecurity: Not on file  Transportation Needs: Not on file  Physical Activity: Not on file  Stress: Not on file  Social Connections: Not on file  Intimate Partner Violence: Not on file   Review of Systems No N/V Eating okay---but appetite is off    Objective:   Physical Exam Constitutional:      Appearance: Normal appearance.     Comments: Some dry cough Some dyspnea and increased RR even just getting on table  HENT:     Right Ear: Tympanic membrane and ear canal normal.     Left Ear: Tympanic membrane and ear canal normal.  Mouth/Throat:     Pharynx: No oropharyngeal exudate or posterior oropharyngeal erythema.  Pulmonary:     Comments: Decreased breath sounds Doesn't sound tight or wheezy but clear dyspnea Musculoskeletal:     Cervical back: Neck supple.  Lymphadenopathy:     Cervical: No cervical adenopathy.  Neurological:     Mental Status: He is alert.            Assessment & Plan:

## 2022-04-01 NOTE — Assessment & Plan Note (Signed)
Given his high risk, will start paxlovid ER if worsens

## 2022-04-18 DIAGNOSIS — H2513 Age-related nuclear cataract, bilateral: Secondary | ICD-10-CM | POA: Diagnosis not present

## 2022-04-18 DIAGNOSIS — Z0100 Encounter for examination of eyes and vision without abnormal findings: Secondary | ICD-10-CM | POA: Diagnosis not present

## 2022-05-23 ENCOUNTER — Ambulatory Visit
Admission: RE | Admit: 2022-05-23 | Discharge: 2022-05-23 | Disposition: A | Discharge status: Home / Self Care | Payer: Managed Care, Other (non HMO) | Source: Ambulatory Visit | Attending: Internal Medicine | Admitting: Internal Medicine

## 2022-05-23 ENCOUNTER — Other Ambulatory Visit: Payer: Self-pay | Admitting: Pulmonary Disease

## 2022-05-23 DIAGNOSIS — R918 Other nonspecific abnormal finding of lung field: Secondary | ICD-10-CM | POA: Insufficient documentation

## 2022-05-23 DIAGNOSIS — Z87891 Personal history of nicotine dependence: Secondary | ICD-10-CM

## 2022-05-23 DIAGNOSIS — J449 Chronic obstructive pulmonary disease, unspecified: Secondary | ICD-10-CM

## 2022-05-30 ENCOUNTER — Ambulatory Visit (INDEPENDENT_AMBULATORY_CARE_PROVIDER_SITE_OTHER): Payer: Managed Care, Other (non HMO) | Admitting: Pulmonary Disease

## 2022-05-30 ENCOUNTER — Encounter: Payer: Self-pay | Admitting: Pulmonary Disease

## 2022-05-30 VITALS — BP 100/60 | HR 101 | Temp 97.5°F | Ht 72.0 in | Wt 144.8 lb

## 2022-05-30 DIAGNOSIS — Z87891 Personal history of nicotine dependence: Secondary | ICD-10-CM

## 2022-05-30 DIAGNOSIS — R918 Other nonspecific abnormal finding of lung field: Secondary | ICD-10-CM

## 2022-05-30 DIAGNOSIS — J441 Chronic obstructive pulmonary disease with (acute) exacerbation: Secondary | ICD-10-CM | POA: Diagnosis not present

## 2022-05-30 DIAGNOSIS — J449 Chronic obstructive pulmonary disease, unspecified: Secondary | ICD-10-CM

## 2022-05-30 MED ORDER — METHYLPREDNISOLONE 4 MG PO TBPK: ORAL_TABLET | ORAL | 0 refills | Status: DC

## 2022-05-30 NOTE — Patient Instructions (Signed)
We have sent you another prednisone like medication taper to see if this gets you doing better.  We will see him in follow-up in 6 to 8 weeks time call sooner should any new problems arise.

## 2022-05-30 NOTE — Progress Notes (Signed)
Has had 2 pfizer covid vaccines. 

## 2022-05-30 NOTE — Progress Notes (Signed)
Subjective:    Patient ID: Eugene Patel, male    DOB: 28-Mar-1951, 71 y.o.   MRN: [id number] Patient Care Team: Venia Carbon, MD as PCP - General (Pediatrics)  Chief Complaint  Patient presents with   Follow-up    HPI Patient is a 71 year old former smoker (54 PY) who presents for follow-up on the issue of dyspnea on the basis of COPD.  Patient has been maintained on Breztri 2 puffs twice a day however he feels that this does helped but still has significant shortness of breath.  We last saw him on 28 February 2022. 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  He had PFTs previously that showed that he has very severe COPD with an FEV1 of 0.93 L or 24% predicted.  Recall that the patient is enrolled in lung cancer screening program, he had this CT performed in July, this showed some areas of concern on the right apex.  Follow-up with PET/CT showed that this area did not have significant uptake and likely represents pleural-parenchymal scarring.  Patient had a CT follow-up on 11 December, this showed that the areas in question were related to pleural-parenchymal scarring and his CT was classified as a lung RADS 2.  He is now back to yearly lung cancer screening.  Since his last visit here on 18 September he has had some issues with increasing dyspnea which he states he gets usually during the winter.  No fevers, chills or sweats. There is morning cough but no change in sputum production is usually whitish to pale yellow. No hemoptysis.  Does not endorse any weight loss or anorexia.  No orthopnea or paroxysmal nocturnal dyspnea.  No sleep disturbance.  No lower extremity edema or calf tenderness.   He had a prednisone taper on October 2023 due to a mild exacerbation, this was prescribed by Dr. Silvio Pate.  He noted some improvement after that but did not feel he went back to baseline.  He states usually fall and winter are the worst times for  him.      DATA: 10/25/2021 chest x-ray: Hyperinflation, emphysema noted, blunting of the costophrenic angles likely related to diaphragmatic flattening/scarring. 01/10/2022 chest LDCT: Severe changes of centrilobular and paraseptal emphysema, no pleural effusion, multiple lung nodules noted.  Subpleural lung nodule in the right apex measuring 14.1 mm grade pleural-parenchymal scarring.  Multiple liver cysts. 02/21/2022 PET/CT: Area of concern in the right lung apex is favored to represent asymmetric pleural-parenchymal scarring.  23-month follow-up with chest CT is suggested. 02/28/2022 PFT: FEV1 0.83 L or 24% predicted, FVC 2.28 L or 48% predicted, FEV1/FVC 36%, there is significant bronchodilator response with regards to FVC indicating reduced air trapping postbronchodilator.  Lung volumes normalized after bronchodilator.  There is air trapping noted. There is significant airway resistance which also response to bronchodilators.  Diffusion capacity severely reduced. 05/23/2022 CT chest low-dose: Lung RADS 2, benign appearance or behavior, pleural-parenchymal scarring in the upper lobes.  Multiple small lung nodules unchanged.  Review of Systems A 10 point review of systems was performed and it is as noted above otherwise negative.  Patient Active Problem List   Diagnosis Date Noted   COVID-19 virus infection 04/01/2022   Cigarette smoker 06/03/2019   Advance directive discussed with patient 06/03/2019   Elevated PSA 01/29/2018   Tinea cruris 01/23/2017   COPD (chronic obstructive pulmonary disease) with emphysema ([place]) 02/09/2015   Routine general medical examination at a  health care facility 12/19/2012   Psoriasis 12/19/2012   Social History   Tobacco Use   Smoking status: Former    Packs/day: 1.50    Years: 52.00    Total pack years: 78.00    Types: Cigarettes    Quit date: 07/14/2020    Years since quitting: 1.8    Passive exposure: Current   Smokeless tobacco: Never   Substance Use Topics   Alcohol use: No    Alcohol/week: 0.0 standard drinks of alcohol    Comment: Exposed to 2nd hand smoke   No Known Allergies  Current Meds  Medication Sig   albuterol (VENTOLIN HFA) 108 (90 Base) MCG/ACT inhaler INHALE 2 PUFFS INTO THE LUNGS 3 TIMES DAILY AS NEEDED   Budeson-Glycopyrrol-Formoterol (BREZTRI AEROSPHERE) 160-9-4.8 MCG/ACT AERO Inhale 2 puffs into the lungs in the morning and at bedtime.   ipratropium-albuterol (DUONEB) 0.5-2.5 (3) MG/3ML SOLN Take 3 mLs by nebulization 4 (four) times daily.   triamcinolone cream (KENALOG) 0.1 % APPLY TOPICALLY TWICE DAILY AS NEEDED.   Immunization History  Administered Date(s) Administered   Fluad Quad(high Dose 65+) 06/03/2019, 06/08/2020, 06/21/2021   Influenza,inj,Quad PF,6+ Mos 02/09/2015   Pneumococcal Conjugate-13 02/09/2015   Pneumococcal Polysaccharide-23 01/18/2016   Tdap 12/19/2012       Objective:   Physical Exam BP 100/60 (BP Location: Left Arm, Patient Position: Sitting, Cuff Size: Normal)   Pulse (!) 101   Temp (!) 97.5 F (36.4 C) (Oral)   Ht 6' (1.829 m)   Wt 144 lb 12.8 oz (65.7 kg)   SpO2 99%   BMI 19.64 kg/m   SpO2: 99 % O2 Device: None (Room air)  GENERAL: Thin well-developed gentleman respiratory distress.  No use of accessories however sits in tripod position.  No conversational dyspnea.  Hoarse voice. HEAD: Normocephalic, atraumatic.  EYES: Pupils equal, round, reactive to light.  No scleral icterus.  MOUTH: Wears dentures uppers, lowers.  Oral mucosa moist, no thrush. NECK: Supple. No thyromegaly. Trachea midline. No JVD.  No adenopathy. PULMONARY: Distant breath sounds bilaterally.  Poor air movement.  Coarse otherwise, no adventitious sounds. CARDIOVASCULAR: S1 and S2. Regular rate and rhythm.  No rubs, murmurs or gallops heard. ABDOMEN: Scaphoid, otherwise benign. MUSCULOSKELETAL: No joint deformity, no clubbing, no edema.  NEUROLOGIC: No overt focal deficit, no gait  disturbance, speech is fluent. SKIN: Intact,warm,dry. PSYCH: Mood and behavior normal.   Recent low-dose CT chest of 23 May 2022 reviewed with patient.  RADS 2 patient will have a follow-up CT in a year through the lung cancer screening program.     Assessment & Plan:     ICD-10-CM   1. Stage 4 very severe COPD by GOLD classification ([place])  J44.9    Continue Breztri 2 puffs twice a day Continue as needed albuterol (MDI/neb) Follow-up 6 to 8 weeks time or as needed    2. COPD with acute exacerbation (HCC)  J44.1    Methylprednisolone taper pack x 1 No need for inhalers at present    3. Abnormal finding on lung imaging  R91.8    Pleural-parenchymal scarring Stable lung nodules Continue yearly lung cancer screening    4. Former cigarette smoker  Z87.891    No evidence of relapse     Meds ordered this encounter  Medications   methylPREDNISolone (MEDROL DOSEPAK) 4 MG TBPK tablet    Sig: Take as directed in the package.    Dispense:  21 tablet    Refill:  0   Will  see the patient in follow-up in 6 to 8 weeks time he is to contact us prior to that time should any new difficulties arise.   Gailen Shelter, MD Advanced Bronchoscopy PCCM [facility]    *This note was dictated using voice recognition software/Dragon.  Despite best efforts to proofread, errors can occur which can change the meaning. Any transcriptional errors that result from this process are unintentional and may not be fully corrected at the time of dictation.

## 2022-06-20 ENCOUNTER — Encounter: Payer: Self-pay | Admitting: Internal Medicine

## 2022-06-20 ENCOUNTER — Ambulatory Visit (INDEPENDENT_AMBULATORY_CARE_PROVIDER_SITE_OTHER): Payer: Managed Care, Other (non HMO) | Admitting: Internal Medicine

## 2022-06-20 VITALS — BP 100/60 | HR 100 | Temp 97.3°F | Ht 72.0 in | Wt 141.0 lb

## 2022-06-20 DIAGNOSIS — Z0000 Encounter for general adult medical examination without abnormal findings: Secondary | ICD-10-CM | POA: Diagnosis not present

## 2022-06-20 DIAGNOSIS — L409 Psoriasis, unspecified: Secondary | ICD-10-CM

## 2022-06-20 DIAGNOSIS — J439 Emphysema, unspecified: Secondary | ICD-10-CM | POA: Diagnosis not present

## 2022-06-20 DIAGNOSIS — Z1211 Encounter for screening for malignant neoplasm of colon: Secondary | ICD-10-CM

## 2022-06-20 NOTE — Assessment & Plan Note (Signed)
Doing better with duoneb 4 times a day and breztri 2 bid

## 2022-06-20 NOTE — Assessment & Plan Note (Signed)
I have personally reviewed the Medicare Annual Wellness questionnaire and have noted 1. The patient's medical and social history 2. Their use of alcohol, tobacco or illicit drugs 3. Their current medications and supplements 4. The patient's functional ability including ADL's, fall risks, home safety risks and hearing or visual             impairment. 5. Diet and physical activities 6. Evidence for depression or mood disorders  The patients weight, height, BMI and visual acuity have been recorded in the chart I have made referrals, counseling and provided education to the patient based review of the above and I have provided the pt with a written personalized care plan for preventive services.  I have provided you with a copy of your personalized plan for preventive services. Please take the time to review along with your updated medication list.  Will do FIT again Discussed exercise Flu vaccine today Prefers no COVID or shingrix vaccines

## 2022-06-20 NOTE — Progress Notes (Signed)
Subjective:    Patient ID: Eugene Patel, male    DOB: 05-21-1951, 72 y.o.   MRN: [id number]  HPI Here for Medicare wellness visit and follow up of chronic health conditions Reviewed advanced directives Reviewed other doctors--[facility] No hospitalizations or surgery in the past year Vision is good--new glasses Hearing is okay No cigarettes No alcohol No exercise---does do outdoor work ([place] acres) No falls No depression or anhedonia Independent with instrumental ADLs Mild memory issues--mostly name recall  Doing okay Still driving full time  Still off cigarettes Breathing is okay with pretty good No regular cough lately  Current Outpatient Medications on File Prior to Visit  Medication Sig Dispense Refill   albuterol (VENTOLIN HFA) 108 (90 Base) MCG/ACT inhaler INHALE 2 PUFFS INTO THE LUNGS 3 TIMES DAILY AS NEEDED 8.5 g 2   Budeson-Glycopyrrol-Formoterol (BREZTRI AEROSPHERE) 160-9-4.8 MCG/ACT AERO Inhale 2 puffs into the lungs in the morning and at bedtime. 10.7 g 11   ipratropium-albuterol (DUONEB) 0.5-2.5 (3) MG/3ML SOLN Take 3 mLs by nebulization 4 (four) times daily. 360 mL 6   triamcinolone cream (KENALOG) 0.1 % APPLY TOPICALLY TWICE DAILY AS NEEDED. 45 g 3   No current facility-administered medications on file prior to visit.    No Known Allergies  Past Medical History:  Diagnosis Date   Emphysema lung (HCC)     Past Surgical History:  Procedure Laterality Date   CHOLECYSTECTOMY  2010    Family History  Problem Relation Age of Onset   Diabetes Father    Diabetes Other    Cancer Neg Hx    Heart disease Neg Hx    Hypertension Neg Hx     Social History   Socioeconomic History   Marital status: Married    Spouse name: Not on file   Number of children: 2   Years of education: Not on file   Highest education level: Not on file  Occupational History   Occupation: Truck driver    Comment: distance driving   Tobacco Use   Smoking status: Former    Packs/day: 1.50    Years: 52.00    Total pack years: 78.00    Types: Cigarettes    Quit date: 07/14/2020    Years since quitting: 1.9    Passive exposure: Current   Smokeless tobacco: Never  Substance and Sexual Activity   Alcohol use: No    Alcohol/week: 0.0 standard drinks of alcohol    Comment: Exposed to 2nd hand smoke   Drug use: No   Sexual activity: Not on file  Other Topics Concern   Not on file  Social History Narrative   No living will   Requests wife make decisions for him, alternate is daughter Aram Beecham   Would accept resuscitation but no prolonged ventilation   Probably wouldn't want tube feeds if cognitively unaware   Social Determinants of Health   Financial Resource Strain: Not on file  Food Insecurity: Not on file  Transportation Needs: Not on file  Physical Activity: Not on file  Stress: Not on file  Social Connections: Not on file  Intimate Partner Violence: Not on file   Review of Systems Appetite is good Weight is stable Sleeps okay Wears seat belt Full dentures--no dentist No suspicious skin lesions--psoriasis quiet No chest pain No palpitations---other than with extreme exertion No dizziness or syncope No heartburn or dysphagia Bowels are fine--no blood No sig back or joint pains    Objective:  Physical Exam Constitutional:      Appearance: Normal appearance.  HENT:     Mouth/Throat:     Comments: No lesions Eyes:     Conjunctiva/sclera: Conjunctivae normal.     Pupils: Pupils are equal, round, and reactive to light.  Cardiovascular:     Rate and Rhythm: Normal rate and regular rhythm.     Pulses: Normal pulses.     Heart sounds: No murmur heard.    No gallop.  Pulmonary:     Effort: Pulmonary effort is normal.     Breath sounds: No wheezing or rales.     Comments: Decreased breath sounds but clear Musculoskeletal:     Cervical back: Neck supple.     Right lower leg: No edema.     Left  lower leg: No edema.  Lymphadenopathy:     Cervical: No cervical adenopathy.  Skin:    Findings: No lesion or rash.  Neurological:     General: No focal deficit present.     Mental Status: He is alert and oriented to person, place, and time.     Comments: Word naming-- 8/1 minute Recall 2/3            Assessment & Plan:

## 2022-06-20 NOTE — Assessment & Plan Note (Signed)
Quiet now No Rx 

## 2022-06-21 LAB — COMPREHENSIVE METABOLIC PANEL
ALT: 17 U/L (ref 0–53)
AST: 21 U/L (ref 0–37)
Albumin: 3.9 g/dL (ref 3.5–5.2)
Alkaline Phosphatase: 58 U/L (ref 39–117)
BUN: 11 mg/dL (ref 6–23)
CO2: 30 mEq/L (ref 19–32)
Calcium: 9 mg/dL (ref 8.4–10.5)
Chloride: 103 mEq/L (ref 96–112)
Creatinine, Ser: 0.96 mg/dL (ref 0.40–1.50)
GFR: 78.62 mL/min (ref >60.00)
Glucose, Bld: 70 mg/dL (ref 70–99)
Potassium: 3.6 mEq/L (ref 3.5–5.1)
Sodium: 140 mEq/L (ref 135–145)
Total Bilirubin: 0.6 mg/dL (ref 0.2–1.2)
Total Protein: 6.4 g/dL (ref 6.0–8.3)

## 2022-06-21 LAB — CBC
HCT: 43.6 % (ref 39.0–52.0)
Hemoglobin: 14.7 g/dL (ref 13.0–17.0)
MCHC: 33.6 g/dL (ref 30.0–36.0)
MCV: 95.3 fl (ref 78.0–100.0)
Platelets: 271 10*3/uL (ref 150.0–400.0)
RBC: 4.58 Mil/uL (ref 4.22–5.81)
RDW: 14.1 % (ref 11.5–15.5)
WBC: 9 10*3/uL (ref 4.0–10.5)

## 2022-06-22 ENCOUNTER — Telehealth: Payer: Self-pay

## 2022-06-22 NOTE — Telephone Encounter (Signed)
Left message for pt. I just need to know if he wanted his MyChart account disabled.

## 2022-06-22 NOTE — Telephone Encounter (Signed)
Pt called returning Shannon's missed call. Told pt Shannon's response, pt stated he did want his account disabled.

## 2022-06-22 NOTE — Telephone Encounter (Signed)
[place] [facility] - Client Nonclinical Telephone Record  [facility] Client [place] [facility] - Client Client Site [place] - Night Contact Type Call Who Is Calling Patient / Member / Family / Caregiver Caller Name Huguley Phone Number n/a Patient Name Eugene Patel Patient DOB [birth year] Call Type Message Only Information Provided Reason for Call Request for General Office Information Initial Comment Caller states that they need to talk with [facility] Additional Comment Caller states to call the number on file. Disp. Time Disposition Final User 06/21/2022 5:12:13 PM General Information Provided Yes Essie Hart Call Closed By: Essie Hart Transaction Date/Time: 06/21/2022 5:10:20 PM (ET   I tried to call pt and left v/m requesting pt to call Narcissa. Sending note to [place] pool.

## 2022-06-23 NOTE — Telephone Encounter (Signed)
Thank you. I have deactivated his MyChart

## 2022-07-15 ENCOUNTER — Other Ambulatory Visit: Payer: Self-pay | Admitting: Internal Medicine

## 2022-07-18 ENCOUNTER — Ambulatory Visit
Admission: RE | Admit: 2022-07-18 | Discharge: 2022-07-18 | Disposition: A | Discharge status: Home / Self Care | Payer: Managed Care, Other (non HMO) | Source: Ambulatory Visit | Attending: Pulmonary Disease | Admitting: Pulmonary Disease

## 2022-07-18 ENCOUNTER — Ambulatory Visit (INDEPENDENT_AMBULATORY_CARE_PROVIDER_SITE_OTHER): Payer: Managed Care, Other (non HMO) | Admitting: Pulmonary Disease

## 2022-07-18 ENCOUNTER — Encounter: Payer: Self-pay | Admitting: Pulmonary Disease

## 2022-07-18 VITALS — BP 100/60 | HR 84 | Temp 97.9°F | Ht 72.0 in | Wt 148.4 lb

## 2022-07-18 DIAGNOSIS — J441 Chronic obstructive pulmonary disease with (acute) exacerbation: Secondary | ICD-10-CM

## 2022-07-18 DIAGNOSIS — J449 Chronic obstructive pulmonary disease, unspecified: Secondary | ICD-10-CM | POA: Diagnosis not present

## 2022-07-18 MED ORDER — METHYLPREDNISOLONE ACETATE 80 MG/ML IJ SUSP
80.0000 mg | Freq: Once | INTRAMUSCULAR | Status: AC
  Administered 2022-07-18: 80 mg via INTRAMUSCULAR

## 2022-07-18 MED ORDER — ALBUTEROL SULFATE (2.5 MG/3ML) 0.083% IN NEBU
2.5000 mg | INHALATION_SOLUTION | Freq: Once | RESPIRATORY_TRACT | Status: AC
  Administered 2022-07-18: 2.5 mg via RESPIRATORY_TRACT

## 2022-07-18 MED ORDER — METHYLPREDNISOLONE 4 MG PO TBPK: ORAL_TABLET | ORAL | 0 refills | Status: DC

## 2022-07-18 MED ORDER — DOXYCYCLINE HYCLATE 100 MG PO TABS: 100.0000 mg | ORAL_TABLET | Freq: Two times a day (BID) | ORAL | 0 refills | Status: DC

## 2022-07-18 MED ORDER — ALBUTEROL SULFATE (2.5 MG/3ML) 0.083% IN NEBU: 2.5000 mg | INHALATION_SOLUTION | Freq: Four times a day (QID) | RESPIRATORY_TRACT | 11 refills | Status: DC | PRN

## 2022-07-18 NOTE — Progress Notes (Signed)
Subjective:    Patient ID: Eugene Patel, male    DOB: 1950/06/18, 72 y.o.   MRN: [id number] Patient Care Team: Venia Carbon, MD as PCP - General (Pediatrics)  Chief Complaint  Patient presents with   Follow-up    Cough with clear sputum and runny nose for 1 week. SOB with exertion. No wheezing.    HPI Patient is a 72 year old former smoker (24 PY) who presents for follow-up on the issue of dyspnea on the basis of a severe COPD. I last saw him on 30 May 2022 and at that time he had a mild exacerbation.Patient has been maintained on Breztri 2 puffs twice a day feels that this has helped however in the last week he has noted increased cough with sputum production yellowish to occasionally pale green.  No fevers, chills or sweats.  He was seen at urgent care and received just a few doses of prednisone but he has not had relief from this.  He was tested for flu, COVID and RSV and was negative.  This was within this past week.  He does not endorse any other symptomatology.  He does note that he has to use Ventolin at least twice to 3 times per week but since he started with his new symptoms he has had to use it 2-3 times per day.  No chest pain, no lower extremity edema no calf tenderness.   DATA: 10/25/2021 chest x-ray: Hyperinflation, emphysema noted, blunting of the costophrenic angles likely related to diaphragmatic flattening/scarring. 01/10/2022 chest LDCT: Severe changes of centrilobular and paraseptal emphysema, no pleural effusion, multiple lung nodules noted.  Subpleural lung nodule in the right apex measuring 14.1 mm grade pleural-parenchymal scarring.  Multiple liver cysts. 02/21/2022 PET/CT: Area of concern in the right lung apex is favored to represent asymmetric pleural-parenchymal scarring.  75-monthfollow-up with chest CT is suggested. 02/28/2022 PFT: FEV1 0.83 L or 24% predicted, FVC 2.28 L or 48% predicted, FEV1/FVC 36%, there is significant bronchodilator response  with regards to FVC indicating reduced air trapping postbronchodilator.  Lung volumes normalized after bronchodilator.  There is air trapping noted. There is significant airway resistance which also response to bronchodilators.  Diffusion capacity severely reduced. 05/23/2022 CT chest low-dose: Lung RADS 2, benign appearance or behavior, pleural-parenchymal scarring in the upper lobes.  Multiple small lung nodules unchanged.   Review of Systems     Current Meds  Medication Sig   albuterol (VENTOLIN HFA) 108 (90 Base) MCG/ACT inhaler INHALE 2 PUFFS INTO THE LUNGS 3 TIMES DAILY AS NEEDED   Budeson-Glycopyrrol-Formoterol (BREZTRI AEROSPHERE) 160-9-4.8 MCG/ACT AERO Inhale 2 puffs into the lungs in the morning and at bedtime.   ipratropium-albuterol (DUONEB) 0.5-2.5 (3) MG/3ML SOLN Take 3 mLs by nebulization 4 (four) times daily.   triamcinolone cream (KENALOG) 0.1 % APPLY TOPICALLY TWICE DAILY AS NEEDED.        Objective:   Physical Exam BP 100/60 (BP Location: Left Arm, Cuff Size: Normal)   Pulse 84   Temp 97.9 F (36.6 C)   Ht 6' (1.829 m)   Wt 148 lb 6.4 oz (67.3 kg)   SpO2 99%   BMI 20.13 kg/m   SpO2: 99 % O2 Device: None (Room air)  GENERAL: Thin well-developed gentleman in no respiratory distress.  No use of accessories noted.  No conversational dyspnea.  Hoarse voice. HEAD: Normocephalic, atraumatic.  EYES: Pupils equal, round, reactive to light.  No scleral icterus.  MOUTH: Wears dentures uppers, lowers.  Oral mucosa  moist, no thrush. NECK: Supple. No thyromegaly. Trachea midline. No JVD.  No adenopathy. PULMONARY: Distant breath sounds bilaterally.  Poor air movement.  Coarse there are,end expiratory wheezes noted, few rhonchi.Positive Hoover sign. CARDIOVASCULAR: S1 and S2. Regular rate and rhythm.  No rubs, murmurs or gallops heard. ABDOMEN: Scaphoid, otherwise benign. MUSCULOSKELETAL: No joint deformity, no clubbing, no edema.  NEUROLOGIC: No overt focal deficit, no  gait disturbance, speech is fluent. SKIN: Intact,warm,dry. PSYCH: Mood and behavior normal.   Patient received nebulization treatment with albuterol 2.5 mg.  Prior to the treatment the patient was noted to have significant wheezing.  After the nebulizer treatment the patient noted relief of symptoms of dyspnea and wheezing had subsided.     Assessment & Plan:     ICD-10-CM   1. Stage 4 very severe COPD by GOLD classification ([place])  J44.9 albuterol (PROVENTIL) (2.5 MG/3ML) 0.083% nebulizer solution 2.5 mg   Continue Breztri 2 puffs twice a day Continue as needed albuterol Continue nebulizer medications    2. COPD with acute exacerbation (HCC)  J44.1 DG Chest 2 View    methylPREDNISolone acetate (DEPO-MEDROL) injection 80 mg   Depo-Medrol followed by Medrol Dosepak Doxycycline 100 mg twice daily x 7 days Albuterol for nebulization to use as needed CX-ray rule out pneumonia     Orders Placed This Encounter  Procedures   DG Chest 2 View    Rule out pneumonia    Standing Status:   Future    Number of Occurrences:   1    Standing Expiration Date:   07/19/2023    Order Specific Question:   Reason for Exam (SYMPTOM  OR DIAGNOSIS REQUIRED)    Answer:   COPD exacerbation    Order Specific Question:   Preferred imaging location?    Answer:   [facility]   Meds ordered this encounter  Medications   albuterol (PROVENTIL) (2.5 MG/3ML) 0.083% nebulizer solution 2.5 mg   methylPREDNISolone (MEDROL DOSEPAK) 4 MG TBPK tablet    Sig: Take as directed in the package.  Start on 19 July 2022 (Tuesday)    Dispense:  21 tablet    Refill:  0   doxycycline (VIBRA-TABS) 100 MG tablet    Sig: Take 1 tablet (100 mg total) by mouth 2 (two) times daily.    Dispense:  14 tablet    Refill:  0   albuterol (PROVENTIL) (2.5 MG/3ML) 0.083% nebulizer solution    Sig: Take 3 mLs (2.5 mg total) by nebulization every 6 (six) hours as needed for wheezing or shortness of breath.    Dispense:  75 mL     Refill:  11    Discontinue DuoNeb   methylPREDNISolone acetate (DEPO-MEDROL) injection 80 mg   Will see the patient in follow-up in 4 to 6 weeks time he is to contact us prior to that time should any new difficulties arise.  Total visit time 42 minutes.   Renold Don, MD Advanced Bronchoscopy PCCM [facility]    *This note was dictated using voice recognition software/Dragon.  Despite best efforts to proofread, errors can occur which can change the meaning. Any transcriptional errors that result from this process are unintentional and may not be fully corrected at the time of dictation.

## 2022-07-18 NOTE — Patient Instructions (Addendum)
We placed in an order for chest x-ray today.  We provided you with a shot to start reducing inflammation in your airways.  I sent a prescription for the same medication as the shot that we will start tomorrow and you will taper it off as directed in the package.  I sent a prescription for an antibiotic.  Use your nebulizer as needed.  Continue Breztri 2 puffs twice a day.  We switch the medication to your nebulizer which will be better for you.  You did very well with this medication at the office today.  We will see you in follow-up in 4 to 6 weeks time call sooner should any new problems arise.

## 2022-07-25 ENCOUNTER — Other Ambulatory Visit: Payer: Self-pay | Admitting: Pulmonary Disease

## 2022-08-22 ENCOUNTER — Ambulatory Visit (INDEPENDENT_AMBULATORY_CARE_PROVIDER_SITE_OTHER): Payer: Managed Care, Other (non HMO) | Admitting: Pulmonary Disease

## 2022-08-22 ENCOUNTER — Encounter: Payer: Self-pay | Admitting: Pulmonary Disease

## 2022-08-22 VITALS — BP 138/70 | HR 72 | Ht 72.0 in | Wt 146.0 lb

## 2022-08-22 DIAGNOSIS — J449 Chronic obstructive pulmonary disease, unspecified: Secondary | ICD-10-CM

## 2022-08-22 DIAGNOSIS — Z87891 Personal history of nicotine dependence: Secondary | ICD-10-CM | POA: Diagnosis not present

## 2022-08-22 NOTE — Patient Instructions (Signed)
You are doing markedly better.  You may decrease use of the nebulizer to 4 times a day only if needed.  We will see on follow-up in 6 months time call sooner should any new problems arise.

## 2022-08-22 NOTE — Progress Notes (Signed)
Subjective:    Patient ID: Eugene Patel, male    DOB: 05/21/1951, 72 y.o.   MRN: [id number] Patient Care Team: Venia Carbon, MD as PCP - General (Pediatrics)  Chief Complaint  Patient presents with   Follow-up    Breathing is the same. SOB. No wheezing or cough.     HPI Patient is a 72 year old former smoker (48 PY) who presents for follow-up on the issue of dyspnea on the basis of severe COPD. I last saw him on 18 July 2022 and at that time he had an exacerbation.at that time he had to be treated with Medrol Dosepak, doxycycline and he was also provided with albuterol for his nebulizer.  He has been using the albuterol 4 times a day.  Using the [facility] 2 puffs twice a day.  Notes that he is feeling markedly better.  He feels he can decrease the use of albuterol to as needed.  He has had no fevers, chills or sweats since last visit.  He has had no cough or sputum production.  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 No chest pain, no lower extremity edema no calf tenderness.  He does not endorse any other symptomatology today.     DATA: 10/25/2021 chest x-ray: Hyperinflation, emphysema noted, blunting of the costophrenic angles likely related to diaphragmatic flattening/scarring. 01/10/2022 chest LDCT: Severe changes of centrilobular and paraseptal emphysema, no pleural effusion, multiple lung nodules noted.  Subpleural lung nodule in the right apex measuring 14.1 mm grade pleural-parenchymal scarring.  Multiple liver cysts. 02/21/2022 PET/CT: Area of concern in the right lung apex is favored to represent asymmetric pleural-parenchymal scarring.  88-monthfollow-up with chest CT is suggested. 02/28/2022 PFT: FEV1 0.83 L or 24% predicted, FVC 2.28 L or 48% predicted, FEV1/FVC 36%, there is significant bronchodilator response with regards to FVC indicating reduced air trapping postbronchodilator.  Lung volumes normalized after bronchodilator.  There is air trapping  noted. There is significant airway resistance which also response to bronchodilators.  Diffusion capacity severely reduced. 05/23/2022 CT chest low-dose: Lung RADS 2, benign appearance or behavior, pleural-parenchymal scarring in the upper lobes.  Multiple small lung nodules unchanged.  Review of Systems A 10 point review of systems was performed and it is as noted above otherwise negative.  Patient Active Problem List   Diagnosis Date Noted   Advance directive discussed with patient 06/03/2019   Tinea cruris 01/23/2017   COPD (chronic obstructive pulmonary disease) [place]ysema [place] 02/09[place]2015   Routine general medical examination at a health care facility 12/19/2012   Psoriasis 12/19/2012   Social History   Tobacco Use   Smoking status: Former    Packs/day: 1.50    Years: 52.00    Total pack years: 78.00    Types: Cigarettes    Quit date: 07/14/2020    Years since quitting: 2.1    Passive exposure: Current   Smokeless tobacco: Never  Substance Use Topics   Alcohol use: No    Alcohol/week: 0.0 standard drinks of alcohol    Comment: Exposed to 2nd hand smoke   No Known Allergies  Current Meds  Medication Sig   albuterol (PROVENTIL) (2.5 MG/3ML) 0.083% nebulizer solution TAKE 3 MLS BY NEBULIZATION EVERY 6 HOURSAS NEEDED FOR WHEEZING OR SHORTNESS OF BREATH   albuterol (VENTOLIN HFA) 108 (90 Base) MCG/ACT inhaler INHALE 2 PUFFS INTO THE LUNGS 3 TIMES DAILY AS NEEDED   Budeson-Glycopyrrol-Formoterol (BREZTRI AEROSPHERE) 160-9-4.8 MCG/ACT AERO Inhale 2 puffs into the lungs  in the morning and at bedtime.   ipratropium-albuterol (DUONEB) 0.5-2.5 (3) MG/3ML SOLN Take 3 mLs by nebulization 4 (four) times daily.   triamcinolone cream (KENALOG) 0.1 % APPLY TOPICALLY TWICE DAILY AS NEEDED.   Immunization History  Administered Date(s) Administered   Fluad Quad(high Dose 65+) 06/03/2019, 06/08/2020, 06/21/2021   Influenza,inj,Quad PF,6+ Mos 02/09/2015   PFIZER(Purple Top)SARS-COV-2  Vaccination 08/12/2019, 09/12/2019   Pneumococcal Conjugate-13 02/09/2015   Pneumococcal Polysaccharide-23 01/18/2016   Tdap 12/19/2012       Objective:   Physical Exam BP 138/70 (BP Location: Left Arm, Cuff Size: Normal)   Pulse 72   Ht 6' (1.829 m)   Wt 146 lb (66.2 kg)   SpO2 100%   BMI 19.80 kg/m   SpO2: 100 % O2 Device: None (Room air)  GENERAL: Thin well-developed gentleman in no respiratory distress.  No use of accessories noted.  No conversational dyspnea.  Hoarse voice. HEAD: Normocephalic, atraumatic.  EYES: Pupils equal, round, reactive to light.  No scleral icterus.  MOUTH: Wears dentures uppers, lowers.  Oral mucosa moist, no thrush. NECK: Supple. No thyromegaly. Trachea midline. No JVD.  No adenopathy. PULMONARY: Distant breath sounds bilaterally.  Poor air movement.  Coarse there are,end expiratory wheezes noted, few rhonchi.Positive Hoover sign. CARDIOVASCULAR: S1 and S2. Regular rate and rhythm.  No rubs, murmurs or gallops heard. ABDOMEN: Scaphoid, otherwise benign. MUSCULOSKELETAL: No joint deformity, no clubbing, no edema.  NEUROLOGIC: No overt focal deficit, no gait disturbance, speech is fluent. SKIN: Intact,warm,dry. PSYCH: Mood and behavior normal.   Chest x-ray performed 18 July 2022 shows only severe emphysema, no infiltrates:     Assessment & Plan:     ICD-10-CM   1. Stage 4 very severe COPD by GOLD classification ([place])  J44.9    Recent exacerbation has resolved Continue Breztri 2 puffs twice a day Continue albuterol only as needed Follow-up in 6 months time or as needed    2. Former cigarette smoker  Z87.891    He is enrolled in lung cancer screening program No evidence of relapse     Will see the patient in follow-up in 6 months time he is to contact us prior to that time should any new difficulties arise.  Eugene Don, MD Advanced Bronchoscopy PCCM [facility]    *This note was dictated using voice  recognition software/Dragon.  Despite best efforts to proofread, errors can occur which can change the meaning. Any transcriptional errors that result from this process are unintentional and may not be fully corrected at the time of dictation.

## 2022-10-18 IMAGING — CR DG CHEST 2V
2 series · 2 of 2 positions shown · non-contrast
Comparison: February 19, 2009.

CLINICAL DATA: Shortness of breath for several years with recent
worsening.

EXAM:
CHEST - 2 VIEW

[chest pa]
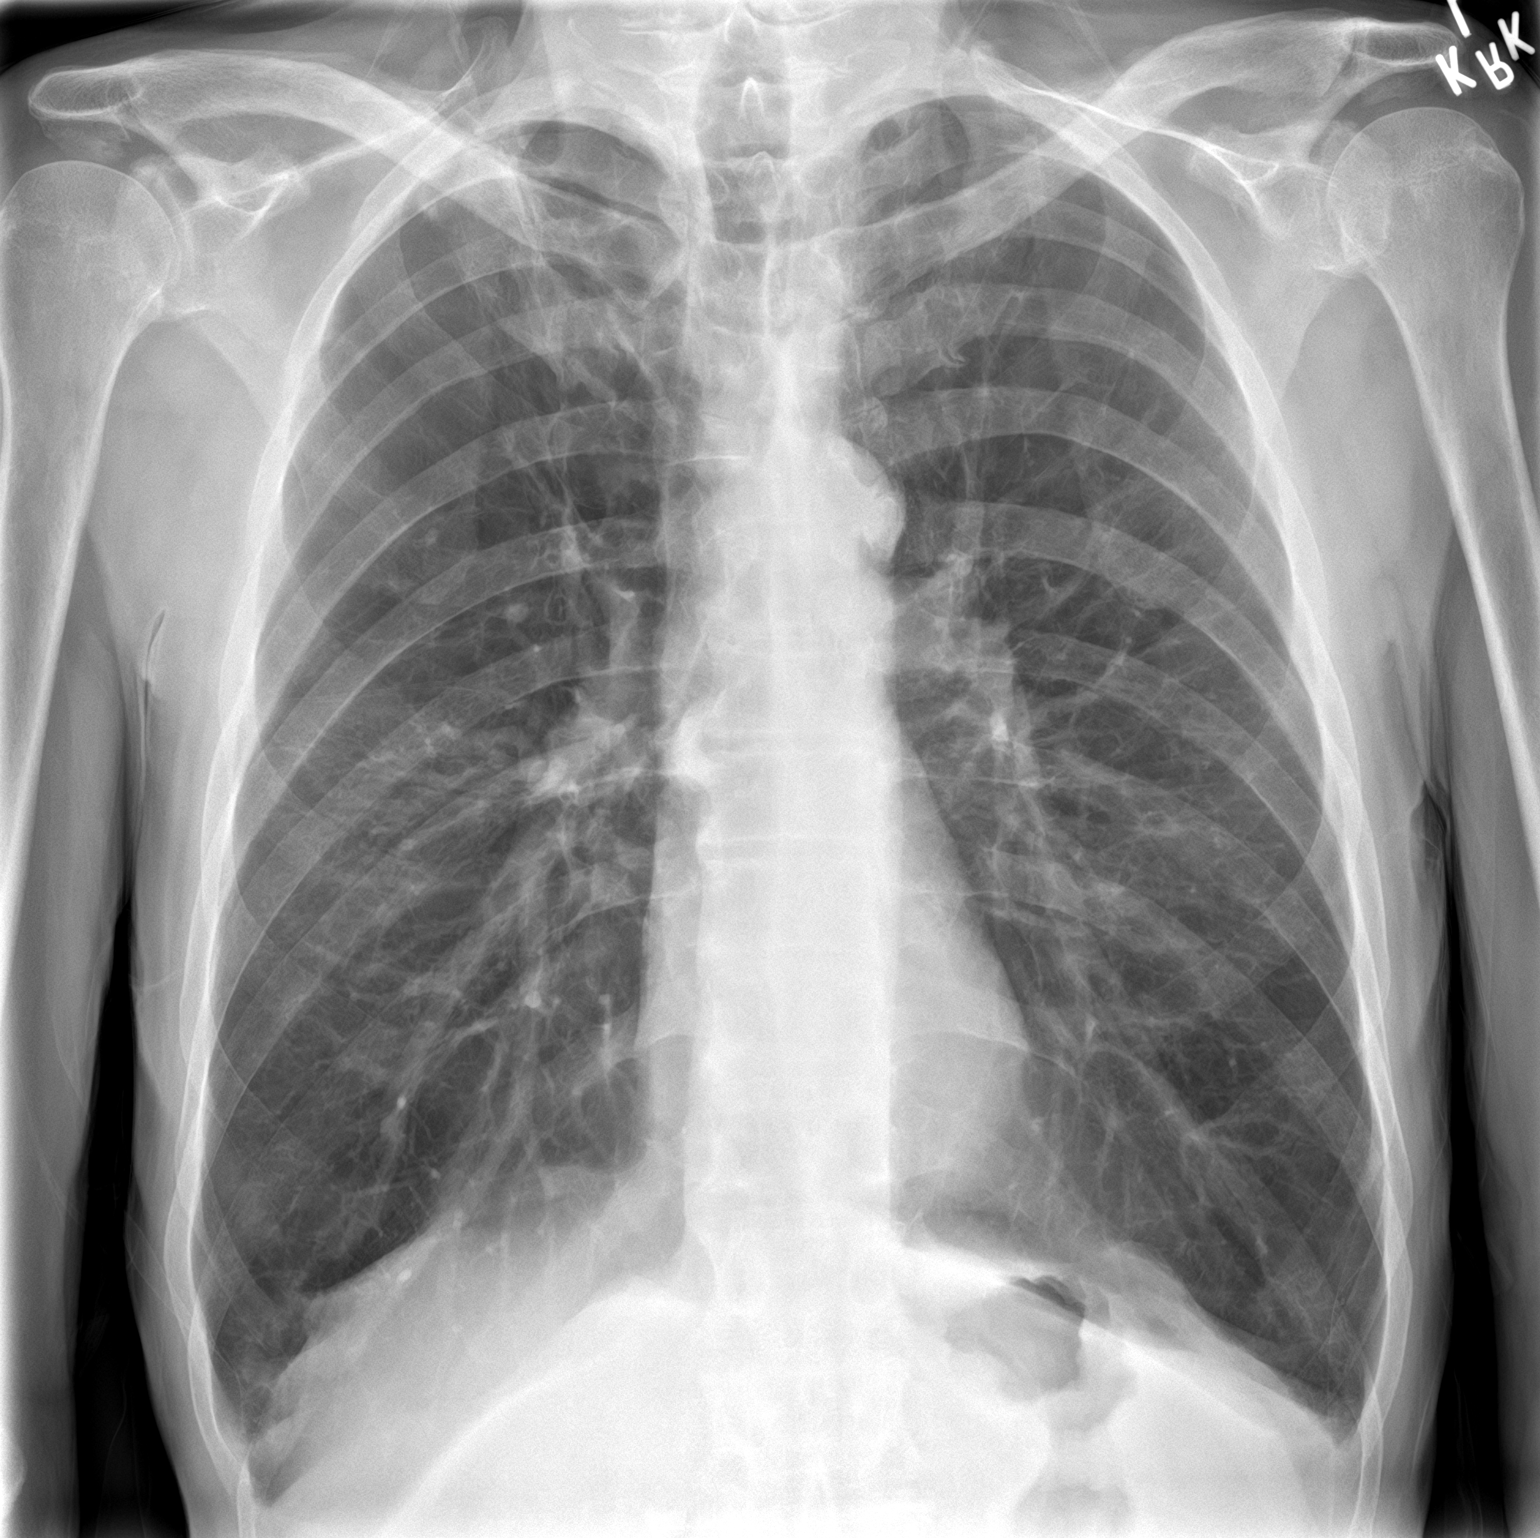

[chest lat]
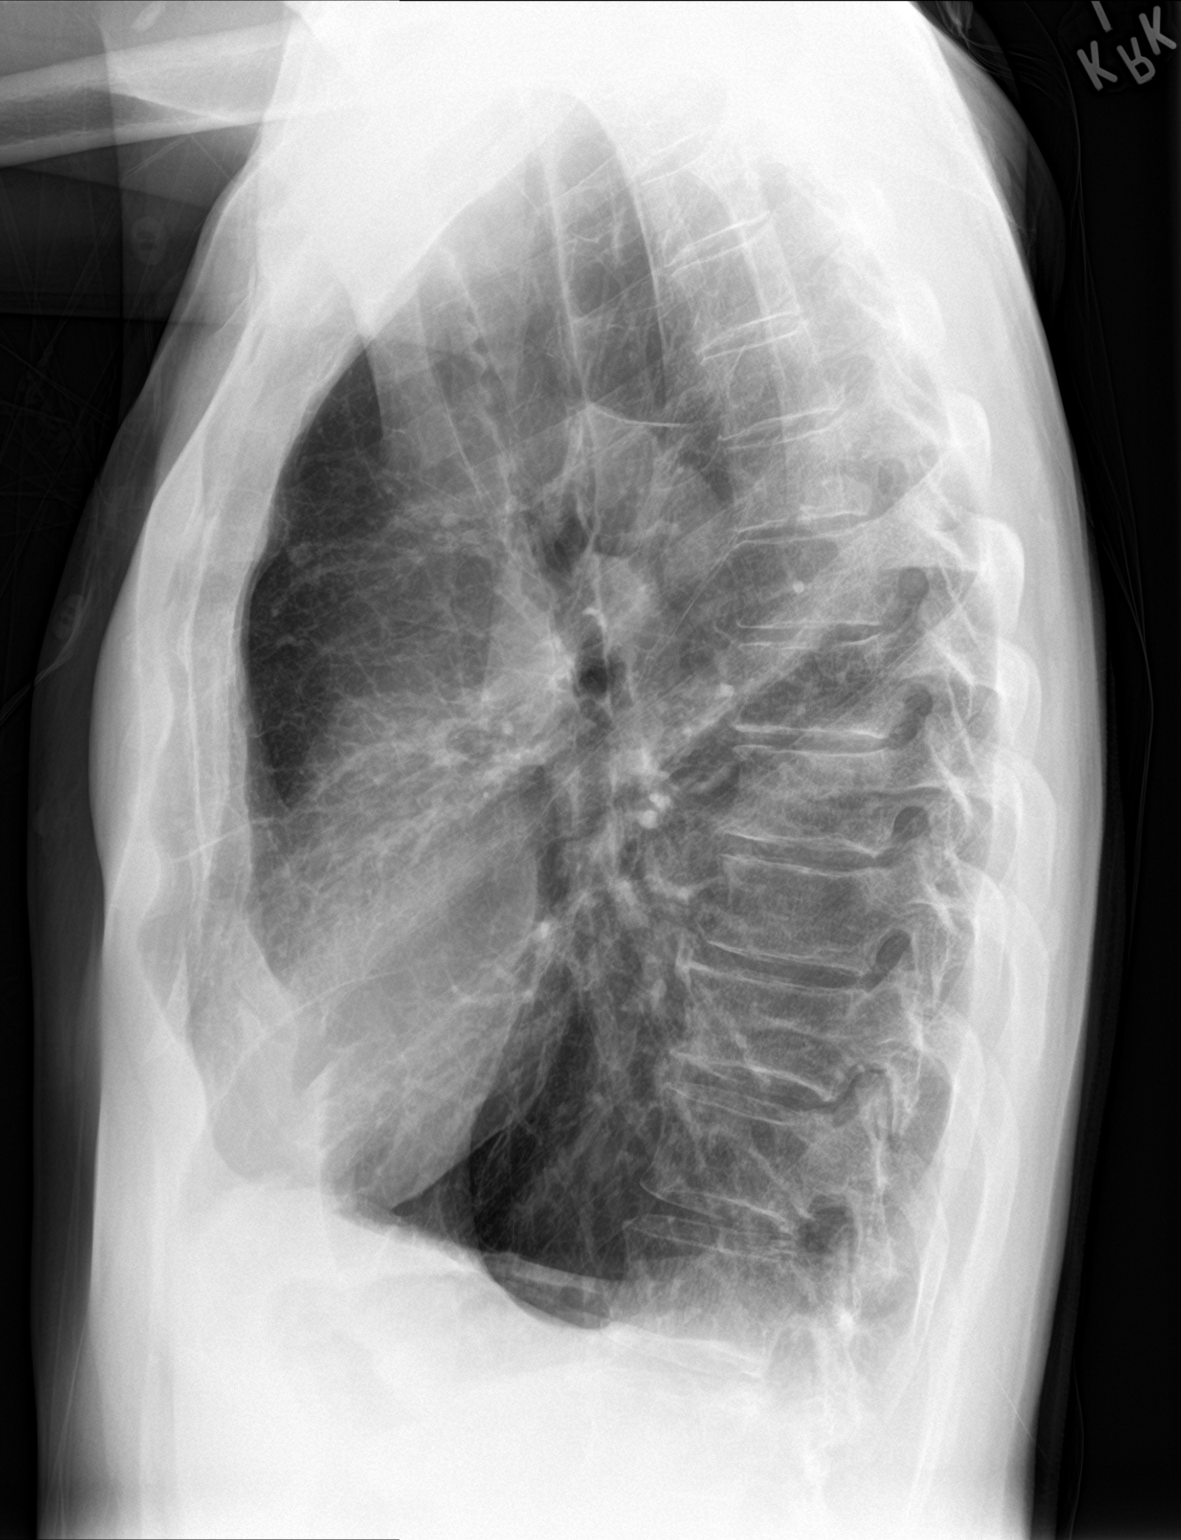

[2 of 2 positions shown; findings below may reference images not displayed]

FINDINGS: Hyperinflation lungs with flattening of the diaphragm. No
pneumothorax. The heart, hila, and mediastinum are normal. No
pulmonary nodules or masses. No focal infiltrates. Blunting of the
costophrenic angles is identified which could represent small
effusions or pleural thickening. I suspect small effusions based on
the lateral view.
IMPRESSION: 1. Emphysematous changes in the lungs with hyperinflation.
2. Suspected small pleural effusions.  No other abnormalities.

## 2022-11-10 ENCOUNTER — Other Ambulatory Visit: Payer: Self-pay | Admitting: Pulmonary Disease

## 2023-05-01 ENCOUNTER — Encounter: Payer: Self-pay | Admitting: Pulmonary Disease

## 2023-05-01 ENCOUNTER — Ambulatory Visit (INDEPENDENT_AMBULATORY_CARE_PROVIDER_SITE_OTHER): Payer: Managed Care, Other (non HMO) | Admitting: Pulmonary Disease

## 2023-05-01 VITALS — BP 118/78 | HR 84 | Temp 97.7°F | Ht 72.0 in | Wt 147.6 lb

## 2023-05-01 DIAGNOSIS — J449 Chronic obstructive pulmonary disease, unspecified: Secondary | ICD-10-CM

## 2023-05-01 DIAGNOSIS — Z23 Encounter for immunization: Secondary | ICD-10-CM | POA: Diagnosis not present

## 2023-05-01 DIAGNOSIS — Z87891 Personal history of nicotine dependence: Secondary | ICD-10-CM

## 2023-05-01 MED ORDER — OHTUVAYRE 3 MG/2.5ML IN SUSP: 3.0000 mg | Freq: Two times a day (BID) | RESPIRATORY_TRACT | Status: DC

## 2023-05-01 NOTE — Patient Instructions (Signed)
VISIT SUMMARY:  Eugene Patel, during your visit today, we discussed your ongoing management of COPD and your general health maintenance. You reported no changes in your breathing but noted limitations in your activities. We also talked about your work schedule and its impact on your ability to attend pulmonary rehab appointments. Additionally, we addressed your smoking history and your wife's current smoking habits. We have made some adjustments to your treatment plan and provided recommendations for your overall health.  YOUR PLAN:  -CHRONIC OBSTRUCTIVE PULMONARY DISEASE (COPD): COPD is a chronic lung condition that makes it hard to breathe. We have started you on a new medication called Ohtuvayre, which you will take using a nebulizer twice daily. If the cost of the medication is an issue, please contact the specialty pharmacy for assistance. We will reassess your condition in March or April to see if a pulmonary rehabilitation program would be beneficial. Additionally, we provided education on how to use the nebulizer, including portable options for use in your truck.  -GENERAL HEALTH MAINTENANCE: You have not yet received your flu shot for the current season, so we administered it today. It is important to keep up with these vaccinations to protect your overall health.  INSTRUCTIONS:  Please schedule a follow-up appointment in three months. If you experience any issues before your next visit, contact the clinic immediately.

## 2023-05-01 NOTE — Progress Notes (Signed)
Subjective:    Patient ID: Eugene Patel, male    DOB: [birth year], 72 y.o.   MRN: [id number]  Patient Care Team: Karie Schwalbe, MD as PCP - General (Pediatrics)  Chief Complaint  Patient presents with   Follow-up    Breathing is the same. DOE. No wheezing or cough.    BACKGROUND/INTERVAL:Patient is a 72 year old former smoker (34 PY) who presents for follow-up on the issue of dyspnea on the basis of severe COPD. I last saw him on 22 August 2022, at that time he appeared fairly well compensated.  In the interim he has not had any exacerbations nor admissions to the hospital.  HPI Discussed the use of AI scribe software for clinical note transcription with the patient, who gave verbal consent to proceed.  History of Present Illness   Eugene Patel, a patient with a known diagnosis of COPD, reports no improvement or worsening in his breathing. 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 He has not participated in any pulmonary rehabilitation or exercise programs, believing his work provides sufficient physical activity.  He has not yet received his annual flu shot. He has been a smoker in the past but reports not having bought any cigarettes in over a year. His wife, however, continues to smoke, albeit at a reduced rate.  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 His work schedule involves leaving home on Monday night and returning on Saturday, which may impact his ability to attend regular medical appointments or rehabilitation programs.     DATA: 10/25/2021 chest x-ray: Hyperinflation, emphysema noted, blunting of the costophrenic angles likely related to diaphragmatic flattening/scarring. 01/10/2022 chest LDCT: Severe changes of centrilobular and paraseptal emphysema, no pleural effusion, multiple  lung nodules noted.  Subpleural lung nodule in the right apex measuring 14.1 mm grade pleural-parenchymal scarring.  Multiple liver cysts. 02/21/2022 PET/CT: Area of concern in the right lung apex is favored to represent asymmetric pleural-parenchymal scarring.  79-month follow-up with chest CT is suggested. 02/28/2022 PFT: FEV1 0.83 L or 24% predicted, FVC 2.28 L or 48% predicted, FEV1/FVC 36%, there is significant bronchodilator response with regards to FVC indicating reduced air trapping postbronchodilator.  Lung volumes normalized after bronchodilator.  There is air trapping noted. There is significant airway resistance which also response to bronchodilators.  Diffusion capacity severely reduced. 05/23/2022 CT chest low-dose: Lung RADS 2, benign appearance or behavior, pleural-parenchymal scarring in the upper lobes. Multiple small lung nodules unchanged.  Review of Systems A 10 point review of systems was performed and it is as noted above otherwise negative.   Patient Active Problem List   Diagnosis Date Noted   Advance directive discussed with patient 06/03/2019   Tinea cruris 01/23/2017   COPD (chronic obstructive pulmonary disease) with emphysema (HCC) 02/09/2015   Routine general medical examination at a health care facility 12/19/2012   Psoriasis 12/19/2012    Social History   Tobacco Use   Smoking status: Former    Current packs/day: 0.00    Average packs/day: 1.5 packs/day for 52.0 years (78.0 ttl pk-yrs)    Types: Cigarettes    Start date: 07/14/1968    Quit date: 07/14/2020    Years since quitting: 2.7    Passive exposure: Current   Smokeless tobacco: Never  Substance Use Topics   Alcohol use: No  Alcohol/week: 0.0 standard drinks of alcohol    Comment: Exposed to 2nd hand smoke    No Known Allergies  Current Meds  Medication Sig   Ensifentrine (OHTUVAYRE) 3 MG/2.5ML SUSP Take 3 mg by nebulization 2 (two) times daily.    Immunization History  Administered  Date(s) Administered   Fluad Quad(high Dose 65+) 06/03/2019, 06/08/2020, 06/21/2021   Fluad Trivalent(High Dose 65+) 05/01/2023   Influenza,inj,Quad PF,6+ Mos 02/09/2015   PFIZER(Purple Top)SARS-COV-2 Vaccination 08/12/2019, 09/12/2019   Pneumococcal Conjugate-13 02/09/2015   Pneumococcal Polysaccharide-23 01/18/2016   Tdap 12/19/2012      Objective:     BP 118/78 (BP Location: Right Arm, Cuff Size: Normal)   Pulse 84   Temp 97.7 F (36.5 C)   Ht 6' (1.829 m)   Wt 147 lb 9.6 oz (67 kg)   SpO2 98%   BMI 20.02 kg/m   SpO2: 98 % O2 Device: None (Room air)  GENERAL: Thin well-developed gentleman in no respiratory distress.  No use of accessories noted.  No conversational dyspnea.  Hoarse voice.  Occasional "grunting". HEAD: Normocephalic, atraumatic.  EYES: Pupils equal, round, reactive to light.  No scleral icterus.  MOUTH: Wears dentures uppers, lowers.  Oral mucosa moist, no thrush. NECK: Supple. No thyromegaly. Trachea midline. No JVD.  No adenopathy. PULMONARY: Distant breath sounds bilaterally.  Poor air movement.  Coarse,otherwise, no adventitious sounds.Positive Hoover's sign. CARDIOVASCULAR: S1 and S2. Regular rate and rhythm.  No rubs, murmurs or gallops heard. ABDOMEN: Scaphoid, otherwise benign. MUSCULOSKELETAL: No joint deformity, no clubbing, no edema.  NEUROLOGIC: No overt focal deficit, no gait disturbance, speech is fluent. SKIN: Intact,warm,dry. PSYCH: Mood and behavior normal.    Assessment & Plan:     ICD-10-CM   1. Stage 4 very severe COPD by GOLD classification (HCC)  J44.9    Persistent significant symptom burden    2. Former heavy tobacco smoker  Z87.891    No evidence of relapse    3. Need for influenza vaccination  Z23 Flu Vaccine Trivalent High Dose (Fluad)     Orders Placed This Encounter  Procedures   Flu Vaccine Trivalent High Dose (Fluad)   Meds ordered this encounter  Medications   Ensifentrine (OHTUVAYRE) 3 MG/2.5ML SUSP    Sig:  Take 3 mg by nebulization 2 (two) times daily.   Discussion:    Chronic Obstructive Pulmonary Disease (COPD) COPD with sub optimal managed symptoms, limited activities due to dyspnea. No prior pulmonary rehabilitation. Continues full-time long-distance hauling, complicating rehab attendance. Smoking cessation achieved over a year ago, but spouse continues to smoke. New medication Ohtuvayre proposed, to be administered via nebulizer twice daily. Discussed potential cost issues and need for assistance program from specialty pharmacy. Positive outcomes reported in other patients using Ohtuvayre. 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 Declines COVID-19 vaccination. - Administer flu shot today  Follow-up - Schedule follow-up appointment in three months - Contact clinic if any issues arise before the next visit.      Gailen Shelter, MD Advanced Bronchoscopy PCCM [facility]    *This note was generated using voice recognition software/Dragon and/or AI transcription program.  Despite best efforts to proofread, errors can occur  which can change the meaning. Any transcriptional errors that result from this process are unintentional and may not be fully corrected at the time of dictation.

## 2023-05-02 ENCOUNTER — Telehealth: Payer: Self-pay | Admitting: Pharmacist

## 2023-05-02 NOTE — Telephone Encounter (Signed)
Received Ohtuvayre new start paperwork from [place] office. Completed and faxed to [facility] with clinicals and insurance cardc copy  Phone#: [phone] Fax#: [phone]  Chesley Mires, PharmD, MPH, BCPS, CPP Clinical Pharmacist (Rheumatology and Pulmonology)

## 2023-05-05 NOTE — Telephone Encounter (Signed)
Received fax from [facility] that patient's enrollment form was received. Rx is being triaged to [facility]. Patient has [employer] as primary and [employer] as secondary  Patient ID: [id number]  Will await f/u if any  Chesley Mires, PharmD, MPH, BCPS, CPP Clinical Pharmacist (Rheumatology and Pulmonology)

## 2023-05-16 ENCOUNTER — Inpatient Hospital Stay (HOSPITAL_COMMUNITY)
Admission: EM | Admit: 2023-05-16 | Discharge: 2023-05-23 | DRG: 871 | Disposition: A | Discharge status: Home / Self Care | Payer: Managed Care, Other (non HMO) | Attending: Internal Medicine | Admitting: Internal Medicine

## 2023-05-16 ENCOUNTER — Other Ambulatory Visit: Payer: Self-pay

## 2023-05-16 ENCOUNTER — Emergency Department (HOSPITAL_COMMUNITY): Payer: Managed Care, Other (non HMO)

## 2023-05-16 DIAGNOSIS — E871 Hypo-osmolality and hyponatremia: Secondary | ICD-10-CM | POA: Diagnosis not present

## 2023-05-16 DIAGNOSIS — J439 Emphysema, unspecified: Secondary | ICD-10-CM | POA: Diagnosis present

## 2023-05-16 DIAGNOSIS — J441 Chronic obstructive pulmonary disease with (acute) exacerbation: Principal | ICD-10-CM

## 2023-05-16 DIAGNOSIS — Z1152 Encounter for screening for COVID-19: Secondary | ICD-10-CM | POA: Diagnosis not present

## 2023-05-16 DIAGNOSIS — Z7722 Contact with and (suspected) exposure to environmental tobacco smoke (acute) (chronic): Secondary | ICD-10-CM | POA: Diagnosis present

## 2023-05-16 DIAGNOSIS — J449 Chronic obstructive pulmonary disease, unspecified: Secondary | ICD-10-CM | POA: Diagnosis not present

## 2023-05-16 DIAGNOSIS — A419 Sepsis, unspecified organism: Secondary | ICD-10-CM | POA: Diagnosis not present

## 2023-05-16 DIAGNOSIS — J438 Other emphysema: Secondary | ICD-10-CM | POA: Diagnosis not present

## 2023-05-16 DIAGNOSIS — Z87891 Personal history of nicotine dependence: Secondary | ICD-10-CM

## 2023-05-16 DIAGNOSIS — J440 Chronic obstructive pulmonary disease with acute lower respiratory infection: Secondary | ICD-10-CM | POA: Diagnosis not present

## 2023-05-16 DIAGNOSIS — Z833 Family history of diabetes mellitus: Secondary | ICD-10-CM | POA: Diagnosis not present

## 2023-05-16 DIAGNOSIS — Z7951 Long term (current) use of inhaled steroids: Secondary | ICD-10-CM

## 2023-05-16 DIAGNOSIS — J13 Pneumonia due to Streptococcus pneumoniae: Secondary | ICD-10-CM | POA: Diagnosis present

## 2023-05-16 DIAGNOSIS — I700 Atherosclerosis of aorta: Secondary | ICD-10-CM | POA: Diagnosis not present

## 2023-05-16 DIAGNOSIS — J189 Pneumonia, unspecified organism: Secondary | ICD-10-CM | POA: Diagnosis not present

## 2023-05-16 DIAGNOSIS — Z79899 Other long term (current) drug therapy: Secondary | ICD-10-CM | POA: Diagnosis not present

## 2023-05-16 DIAGNOSIS — R918 Other nonspecific abnormal finding of lung field: Secondary | ICD-10-CM | POA: Diagnosis not present

## 2023-05-16 DIAGNOSIS — E861 Hypovolemia: Secondary | ICD-10-CM | POA: Diagnosis present

## 2023-05-16 DIAGNOSIS — E872 Acidosis, unspecified: Secondary | ICD-10-CM | POA: Diagnosis not present

## 2023-05-16 DIAGNOSIS — R0609 Other forms of dyspnea: Secondary | ICD-10-CM | POA: Diagnosis not present

## 2023-05-16 DIAGNOSIS — R079 Chest pain, unspecified: Secondary | ICD-10-CM | POA: Diagnosis not present

## 2023-05-16 DIAGNOSIS — Z9049 Acquired absence of other specified parts of digestive tract: Secondary | ICD-10-CM | POA: Diagnosis not present

## 2023-05-16 DIAGNOSIS — I2510 Atherosclerotic heart disease of native coronary artery without angina pectoris: Secondary | ICD-10-CM | POA: Diagnosis not present

## 2023-05-16 LAB — I-STAT CHEM 8, ED
BUN: 12 mg/dL (ref 8–23)
Calcium, Ion: 1.03 mmol/L — ABNORMAL LOW (ref 1.15–1.40)
Chloride: 100 mmol/L (ref 98–111)
Creatinine, Ser: 1 mg/dL (ref 0.61–1.24)
Glucose, Bld: 114 mg/dL — ABNORMAL HIGH (ref 70–99)
HCT: 41 % (ref 39.0–52.0)
Hemoglobin: 13.9 g/dL (ref 13.0–17.0)
Potassium: 3.9 mmol/L (ref 3.5–5.1)
Sodium: 133 mmol/L — ABNORMAL LOW (ref 135–145)
TCO2: 21 mmol/L — ABNORMAL LOW (ref 22–32)

## 2023-05-16 LAB — CBC WITH DIFFERENTIAL/PLATELET
Abs Immature Granulocytes: 0.21 10*3/uL — ABNORMAL HIGH (ref 0.00–0.07)
Basophils Absolute: 0.1 10*3/uL (ref 0.0–0.1)
Basophils Relative: 1 %
Eosinophils Absolute: 0 10*3/uL (ref 0.0–0.5)
Eosinophils Relative: 0 %
HCT: 47.2 % (ref 39.0–52.0)
Hemoglobin: 16.2 g/dL (ref 13.0–17.0)
Immature Granulocytes: 1 %
Lymphocytes Relative: 4 %
Lymphs Abs: 0.9 10*3/uL (ref 0.7–4.0)
MCH: 31.5 pg (ref 26.0–34.0)
MCHC: 34.3 g/dL (ref 30.0–36.0)
MCV: 91.8 fL (ref 80.0–100.0)
Monocytes Absolute: 1.8 10*3/uL — ABNORMAL HIGH (ref 0.1–1.0)
Monocytes Relative: 7 %
Neutro Abs: 21.9 10*3/uL — ABNORMAL HIGH (ref 1.7–7.7)
Neutrophils Relative %: 87 %
Platelets: 289 10*3/uL (ref 150–400)
RBC: 5.14 MIL/uL (ref 4.22–5.81)
RDW: 13.2 % (ref 11.5–15.5)
WBC: 24.9 10*3/uL — ABNORMAL HIGH (ref 4.0–10.5)
nRBC: 0 % (ref 0.0–0.2)

## 2023-05-16 LAB — RESP PANEL BY RT-PCR (RSV, FLU A&B, COVID)  RVPGX2
Influenza A by PCR: NEGATIVE
Influenza B by PCR: NEGATIVE
Resp Syncytial Virus by PCR: NEGATIVE
SARS Coronavirus 2 by RT PCR: NEGATIVE

## 2023-05-16 LAB — COMPREHENSIVE METABOLIC PANEL
ALT: 18 U/L (ref 0–44)
AST: 26 U/L (ref 15–41)
Albumin: 3.6 g/dL (ref 3.5–5.0)
Alkaline Phosphatase: 68 U/L (ref 38–126)
Anion gap: 12 (ref 5–15)
BUN: 11 mg/dL (ref 8–23)
CO2: 19 mmol/L — ABNORMAL LOW (ref 22–32)
Calcium: 8.9 mg/dL (ref 8.9–10.3)
Chloride: 103 mmol/L (ref 98–111)
Creatinine, Ser: 0.99 mg/dL (ref 0.61–1.24)
GFR, Estimated: >60 mL/min (ref >60)
Glucose, Bld: 127 mg/dL — ABNORMAL HIGH (ref 70–99)
Potassium: 3.8 mmol/L (ref 3.5–5.1)
Sodium: 134 mmol/L — ABNORMAL LOW (ref 135–145)
Total Bilirubin: 2.7 mg/dL — ABNORMAL HIGH (ref <1.2)
Total Protein: 6.6 g/dL (ref 6.5–8.1)

## 2023-05-16 LAB — URINALYSIS, W/ REFLEX TO CULTURE (INFECTION SUSPECTED)
Bacteria, UA: NONE SEEN
Bilirubin Urine: NEGATIVE
Glucose, UA: NEGATIVE mg/dL
Ketones, ur: NEGATIVE mg/dL
Leukocytes,Ua: NEGATIVE
Nitrite: NEGATIVE
Protein, ur: NEGATIVE mg/dL
Specific Gravity, Urine: 1.03 (ref 1.005–1.030)
pH: 6 (ref 5.0–8.0)

## 2023-05-16 LAB — PROTIME-INR
INR: 1.1 (ref 0.8–1.2)
Prothrombin Time: 14.3 s (ref 11.4–15.2)

## 2023-05-16 LAB — APTT: aPTT: 29 s (ref 24–36)

## 2023-05-16 LAB — I-STAT CG4 LACTIC ACID, ED: Lactic Acid, Venous: 4.3 mmol/L (ref 0.5–1.9)

## 2023-05-16 MED ORDER — SODIUM CHLORIDE 0.9 % IV SOLN
500.0000 mg | Freq: Once | INTRAVENOUS | Status: AC
  Administered 2023-05-16: 500 mg via INTRAVENOUS
  Filled 2023-05-16: qty 5

## 2023-05-16 MED ORDER — LACTATED RINGERS IV BOLUS
1500.0000 mL | Freq: Once | INTRAVENOUS | Status: AC
  Administered 2023-05-16: 1500 mL via INTRAVENOUS

## 2023-05-16 MED ORDER — IOHEXOL 350 MG/ML SOLN
75.0000 mL | Freq: Once | INTRAVENOUS | Status: AC | PRN
Start: 2023-05-16 — End: 2023-05-16
  Administered 2023-05-16: 75 mL via INTRAVENOUS

## 2023-05-16 MED ORDER — SODIUM CHLORIDE 0.9 % IV SOLN
2.0000 g | Freq: Once | INTRAVENOUS | Status: AC
  Administered 2023-05-16: 2 g via INTRAVENOUS
  Filled 2023-05-16: qty 20

## 2023-05-16 MED ORDER — MAGNESIUM SULFATE 2 GM/50ML IV SOLN
2.0000 g | Freq: Once | INTRAVENOUS | Status: AC
  Administered 2023-05-16: 2 g via INTRAVENOUS
  Filled 2023-05-16: qty 50

## 2023-05-16 MED ORDER — IPRATROPIUM-ALBUTEROL 0.5-2.5 (3) MG/3ML IN SOLN
3.0000 mL | Freq: Once | RESPIRATORY_TRACT | Status: AC
  Administered 2023-05-16: 3 mL via RESPIRATORY_TRACT
  Filled 2023-05-16: qty 3

## 2023-05-16 MED ORDER — METHYLPREDNISOLONE SODIUM SUCC 125 MG IJ SOLR
125.0000 mg | Freq: Once | INTRAMUSCULAR | Status: AC
  Administered 2023-05-16: 125 mg via INTRAVENOUS
  Filled 2023-05-16: qty 2

## 2023-05-16 MED ORDER — ALBUTEROL SULFATE (2.5 MG/3ML) 0.083% IN NEBU
10.0000 mg | INHALATION_SOLUTION | Freq: Once | RESPIRATORY_TRACT | Status: AC
  Administered 2023-05-16: 10 mg via RESPIRATORY_TRACT
  Filled 2023-05-16: qty 12

## 2023-05-16 NOTE — ED Provider Triage Note (Addendum)
Emergency Medicine Provider Triage Evaluation Note  Eugene Patel , a 72 y.o. male  was evaluated in triage.  Pt complains of SOB, cough.  Review of Systems  Positive:  Negative:   Physical Exam  BP 108/80 (BP Location: Right Arm)   Pulse (!) 130   Temp (!) 97.5 F (36.4 C) (Oral)   Resp (!) 32   SpO2 93%  Gen:   Awake, no distress   Resp:  Normal effort  MSK:   Moves extremities without difficulty  Other:    Medical Decision Making  Medically screening exam initiated at 6:08 PM.  Appropriate orders placed.  Eugene Patel was informed that the remainder of the evaluation will be completed by another provider, this initial triage assessment does not replace that evaluation, and the importance of remaining in the ED until their evaluation is complete.  SOB and cough since 3AM. Hx COPD. Patient is a Naval architect. Patient meeting sepsis criteria - may be complicated by recent albuterol usage. Will also order CT for possible PE given the acute onset of the SOB.  Denies fever, chest pain, nausea, vomiting, diarrhea, dysuria, hematuria     Dorthy Cooler, [place] 05/16/23 1814

## 2023-05-16 NOTE — ED Provider Notes (Addendum)
[place] EMERGENCY DEPARTMENT AT [facility] Provider Note  CSN: [id number] Arrival date & time: 05/16/23 1739  Chief Complaint(s) Shortness of Breath (/)  HPI Eugene Patel is a 72 y.o. male history of COPD presenting to the emergency department shortness of breath.  He reports that it feels similar to his chronic COPD exacerbation.  Began this morning.  Did have a long car ride to/from [place], patient is a IT trainer.  Denies chest pain, fevers or chills.  Reports dry cough, runny nose and sore throat.  No abdominal pain.  No back pain.  No syncope.   Past Medical History Past Medical History:  Diagnosis Date   Emphysema lung [facility])    Patient Active Problem List   Diagnosis Date Noted   CAP (community acquired pneumonia) 05/16/2023   Advance directive discussed with patient 06/03/2019   Tinea cruris 01/23/2017   COPD (chronic obstructive pulmonary disease) with emphysema (HCC) 02/09/2015   Routine general medical examination at a health care facility 12/19/2012   Psoriasis 12/19/2012   Home Medication(s) Prior to Admission medications   Medication Sig Start Date End Date Taking? Authorizing Provider  albuterol (PROVENTIL) (2.5 MG/3ML) 0.083% nebulizer solution TAKE 3 MLS BY NEBULIZATION EVERY 6 HOURSAS NEEDED FOR WHEEZING OR SHORTNESS OF BREATH 07/25/22  Yes Salena Saner, MD  albuterol (VENTOLIN HFA) 108 (90 Base) MCG/ACT inhaler INHALE 2 PUFFS INTO THE LUNGS 3 TIMES DAILY AS NEEDED 07/15/22  Yes Karie Schwalbe, MD  BREZTRI AEROSPHERE 160-9-4.8 MCG/ACT AERO INHALE TWO PUFFS INTO THE LUNGS IN THE MORNING AND AT BEDTIME 11/10/22  Yes Salena Saner, MD  diphenhydramine-acetaminophen (TYLENOL PM) 25-500 MG TABS tablet Take 1 tablet by mouth at bedtime as needed.   Yes [provider]  Ensifentrine (OHTUVAYRE) 3 MG/2.5ML SUSP Take 3 mg by nebulization 2 (two) times daily. 05/01/23  Yes Salena Saner, MD  ipratropium-albuterol (DUONEB) 0.5-2.5 (3)  MG/3ML SOLN Take 3 mLs by nebulization 4 (four) times daily. 02/28/22  Yes Salena Saner, MD  triamcinolone cream (KENALOG) 0.1 % APPLY TOPICALLY TWICE DAILY AS NEEDED. 01/03/22  Yes Karie Schwalbe, MD                                                                                                                                    Past Surgical History Past Surgical History:  Procedure Laterality Date   CHOLECYSTECTOMY  2010   Family History Family History  Problem Relation Age of Onset   Diabetes Father    Diabetes Other    Cancer Neg Hx    Heart disease Neg Hx    Hypertension Neg Hx     Social History Social History   Tobacco Use   Smoking status: Former    Current packs/day: 0.00    Average packs/day: 1.5 packs/day for 52.0 years (78.0 ttl pk-yrs)    Types: Cigarettes    Start date: 07/14/1968  Quit date: 07/14/2020    Years since quitting: 2.8    Passive exposure: Current   Smokeless tobacco: Never  Substance Use Topics   Alcohol use: No    Alcohol/week: 0.0 standard drinks of alcohol    Comment: Exposed to 2nd hand smoke   Drug use: No   Allergies Patient has no known allergies.  Review of Systems Review of Systems  All other systems reviewed and are negative.   Physical Exam Vital Signs  I have reviewed the triage vital signs BP 114/68   Pulse (!) 116   Temp (!) 97.4 F (36.3 C) (Oral)   Resp 18   SpO2 96%  Physical Exam Vitals and nursing note reviewed.  Constitutional:      General: He is in acute distress.     Appearance: Normal appearance.  HENT:     Mouth/Throat:     Mouth: Mucous membranes are moist.  Eyes:     Conjunctiva/sclera: Conjunctivae normal.  Cardiovascular:     Rate and Rhythm: Normal rate and regular rhythm.  Pulmonary:     Effort: Tachypnea, accessory muscle usage and respiratory distress present.     Breath sounds: Examination of the right-upper field reveals wheezing. Examination of the left-upper field reveals  wheezing. Examination of the right-middle field reveals wheezing. Examination of the left-middle field reveals wheezing. Examination of the right-lower field reveals wheezing. Examination of the left-lower field reveals wheezing. Wheezing present.  Abdominal:     General: Abdomen is flat.     Palpations: Abdomen is soft.     Tenderness: There is no abdominal tenderness.  Musculoskeletal:     Right lower leg: No edema.     Left lower leg: No edema.  Skin:    General: Skin is warm and dry.     Capillary Refill: Capillary refill takes less than 2 seconds.  Neurological:     Mental Status: He is alert and oriented to person, place, and time. Mental status is at baseline.  Psychiatric:        Mood and Affect: Mood normal.        Behavior: Behavior normal.     ED Results and Treatments Labs (all labs ordered are listed, but only abnormal results are displayed) Labs Reviewed  COMPREHENSIVE METABOLIC PANEL - Abnormal; Notable for the following components:      Result Value   Sodium 134 (*)    CO2 19 (*)    Glucose, Bld 127 (*)    Total Bilirubin 2.7 (*)    All other components within normal limits  CBC WITH DIFFERENTIAL/PLATELET - Abnormal; Notable for the following components:   WBC 24.9 (*)    Neutro Abs 21.9 (*)    Monocytes Absolute 1.8 (*)    Abs Immature Granulocytes 0.21 (*)    All other components within normal limits  URINALYSIS, W/ REFLEX TO CULTURE (INFECTION SUSPECTED) - Abnormal; Notable for the following components:   Color, Urine STRAW (*)    Hgb urine dipstick SMALL (*)    All other components within normal limits  I-STAT CG4 LACTIC ACID, ED - Abnormal; Notable for the following components:   Lactic Acid, Venous 4.3 (*)    All other components within normal limits  I-STAT CHEM 8, ED - Abnormal; Notable for the following components:   Sodium 133 (*)    Glucose, Bld 114 (*)    Calcium, Ion 1.03 (*)    TCO2 21 (*)    All other components within normal limits  RESP PANEL BY RT-PCR (RSV, FLU A&B, COVID)  RVPGX2  CULTURE, BLOOD (ROUTINE X 2)  CULTURE, BLOOD (ROUTINE X 2)  PROTIME-INR  APTT  LACTIC ACID, PLASMA  I-STAT CG4 LACTIC ACID, ED                                                                                                                          Radiology CT Angio Chest PE W/Cm &/Or Wo Cm  Result Date: 05/16/2023 CLINICAL DATA:  Pulmonary embolus suspected with high probability. EXAM: CT ANGIOGRAPHY CHEST WITH CONTRAST TECHNIQUE: Multidetector CT imaging of the chest was performed using the standard protocol during bolus administration of intravenous contrast. Multiplanar CT image reconstructions and MIPs were obtained to evaluate the vascular anatomy. RADIATION DOSE REDUCTION: This exam was performed according to the departmental dose-optimization program which includes automated exposure control, adjustment of the mA and/or kV according to patient size and/or use of iterative reconstruction technique. CONTRAST:  75mL OMNIPAQUE IOHEXOL 350 MG/ML SOLN COMPARISON:  Chest radiograph 05/16/2023.  CT chest 05/23/2022 FINDINGS: Cardiovascular: Technically adequate study with good opacification of the central and segmental pulmonary arteries and mild motion artifact. No focal filling defects are seen. No evidence of significant pulmonary embolus. Normal heart size. No pericardial effusions. Normal caliber thoracic aorta. Minimal calcification in the aorta and coronary arteries. Mediastinum/Nodes: No enlarged mediastinal, hilar, or axillary lymph nodes. Thyroid gland, trachea, and esophagus demonstrate no significant findings. Lungs/Pleura: Severe emphysematous changes in the lungs. Nodular scarring in the right apex is unchanged. New interstitial infiltrates with ground-glass in the left lung base could represent superimposed pneumonia or possibly developing interstitial lung disease. Calcified granulomas. No pleural effusions. No pneumothorax. Upper  Abdomen: Multiple cysts in the liver without change. No imaging follow-up is indicated. Surgical absence of the gallbladder. No acute abnormalities are suggested. Musculoskeletal: Degenerative changes in the spine. No acute bony abnormalities. Review of the MIP images confirms the above findings. IMPRESSION: 1. No evidence of significant pulmonary embolus. 2. Severe emphysematous changes throughout the lungs. Chronic scarring in the right apex. 3. New interstitial and alveolar infiltrates in the left lung base probably representing pneumonia. Less likely developing interstitial lung disease with acute alveolitis. Electronically Signed   By: Burman Nieves M.D.   On: 05/16/2023 21:35   DG Chest 2 View  Result Date: 05/16/2023 CLINICAL DATA:  Possible sepsis cough and chest pain EXAM: CHEST - 2 VIEW COMPARISON:  07/18/2022 FINDINGS: Hyperinflation with emphysema ground-glass opacity at the left peripheral lung base. No pleural effusion. Normal cardiomediastinal silhouette. No pneumothorax IMPRESSION: Hyperinflation with emphysema. Ground-glass opacity at the left peripheral lung base, possible pneumonia. Electronically Signed   By: Jasmine Pang M.D.   On: 05/16/2023 20:21    Pertinent labs & imaging results that were available during my care of the patient were reviewed by me and considered in my medical decision making (see MDM for details).  Medications Ordered in ED Medications  ipratropium-albuterol (DUONEB) 0.5-2.5 (3) MG/3ML nebulizer solution  3 mL (3 mLs Nebulization Given 05/16/23 1826)  iohexol (OMNIPAQUE) 350 MG/ML injection 75 mL (75 mLs Intravenous Contrast Given 05/16/23 2034)  lactated ringers bolus 1,500 mL (1,500 mLs Intravenous New Bag/Given 05/16/23 1952)  methylPREDNISolone sodium succinate (SOLU-MEDROL) 125 mg/2 mL injection 125 mg (125 mg Intravenous Given 05/16/23 1943)  magnesium sulfate IVPB 2 g 50 mL (0 g Intravenous Stopped 05/16/23 2057)  albuterol (PROVENTIL) (2.5 MG/3ML)  0.083% nebulizer solution 10 mg (10 mg Nebulization Given 05/16/23 2035)  cefTRIAXone (ROCEPHIN) 2 g in sodium chloride 0.9 % 100 mL IVPB (0 g Intravenous Stopped 05/16/23 2201)  azithromycin (ZITHROMAX) 500 mg in sodium chloride 0.9 % 250 mL IVPB (500 mg Intravenous New Bag/Given 05/16/23 2140)                                                                                                                                     Procedures .Critical Care  Performed by: Lonell Grandchild, MD Authorized by: Lonell Grandchild, MD   Critical care provider statement:    Critical care time (minutes):  30   Critical care was necessary to treat or prevent imminent or life-threatening deterioration of the following conditions:  Sepsis and respiratory failure   Critical care was time spent personally by me on the following activities:  Development of treatment plan with patient or surrogate, discussions with consultants, evaluation of patient's response to treatment, examination of patient, ordering and review of laboratory studies, ordering and review of radiographic studies, ordering and performing treatments and interventions, pulse oximetry, re-evaluation of patient's condition and review of old charts   (including critical care time)  Medical Decision Making / ED Course   MDM:  72 year old male presenting to the emergency department shortness of breath.  Pulmonary exam with diffuse wheezing.  No obvious focal findings.  He is in mild respiratory distress.  Vitals with tachycardia.  EKG with questionable long QTc although not present on repeat ecg  Suspect patient's symptoms are likely due to COPD exacerbation.  He has diffuse wheezing on exam.  Lower concern for alternative process such as pneumonia, however the patient does have a very high leukocytosis which appears acute.  His chest x-ray does not appear to have any focal findings on my interpretation.  Lower concern for PE, patient did have  prolonged travel and is tachycardic so we will proceed with CT scan which should also help evaluate for any pneumonia.  Will reassess  Clinical Course as of 05/16/23 2328  Tue May 16, 2023  2327 On radiology interpretation of x-ray, he did have left lower lobe pneumonia.  CT scan confirms this without evidence of PE.  Patient has been admitted to the hospital for further antibiotic treatment and management. [WS]    Clinical Course User Index [WS] Lonell Grandchild, MD     Additional history obtained: -Additional history obtained from family -External records from outside source obtained and  reviewed including: Chart review including previous notes, labs, imaging, consultation notes including prior pulmonology notes    Lab Tests: -I ordered, reviewed, and interpreted labs.   The pertinent results include:   Labs Reviewed  COMPREHENSIVE METABOLIC PANEL - Abnormal; Notable for the following components:      Result Value   Sodium 134 (*)    CO2 19 (*)    Glucose, Bld 127 (*)    Total Bilirubin 2.7 (*)    All other components within normal limits  CBC WITH DIFFERENTIAL/PLATELET - Abnormal; Notable for the following components:   WBC 24.9 (*)    Neutro Abs 21.9 (*)    Monocytes Absolute 1.8 (*)    Abs Immature Granulocytes 0.21 (*)    All other components within normal limits  URINALYSIS, W/ REFLEX TO CULTURE (INFECTION SUSPECTED) - Abnormal; Notable for the following components:   Color, Urine STRAW (*)    Hgb urine dipstick SMALL (*)    All other components within normal limits  I-STAT CG4 LACTIC ACID, ED - Abnormal; Notable for the following components:   Lactic Acid, Venous 4.3 (*)    All other components within normal limits  I-STAT CHEM 8, ED - Abnormal; Notable for the following components:   Sodium 133 (*)    Glucose, Bld 114 (*)    Calcium, Ion 1.03 (*)    TCO2 21 (*)    All other components within normal limits  RESP PANEL BY RT-PCR (RSV, FLU A&B, COVID)  RVPGX2   CULTURE, BLOOD (ROUTINE X 2)  CULTURE, BLOOD (ROUTINE X 2)  PROTIME-INR  APTT  LACTIC ACID, PLASMA  I-STAT CG4 LACTIC ACID, ED    Notable for leukocytosis, lactic acidosis   EKG   EKG Interpretation Date/Time:  Tuesday May 16 2023 20:19:44 EST Ventricular Rate:  108 PR Interval:  166 QRS Duration:  83 QT Interval:  321 QTC Calculation: 431 R Axis:   93  Text Interpretation: Sinus tachycardia Ventricular premature complex Right axis deviation Baseline wander Confirmed by Alvino Blood ([id number]) on 05/16/2023 10:19:43 PM         Imaging Studies ordered: I ordered imaging studies including CXR On my interpretation imaging demonstrates pneumonia I independently visualized and interpreted imaging. I agree with the radiologist interpretation   Medicines ordered and prescription drug management: Meds ordered this encounter  Medications   ipratropium-albuterol (DUONEB) 0.5-2.5 (3) MG/3ML nebulizer solution 3 mL   iohexol (OMNIPAQUE) 350 MG/ML injection 75 mL   lactated ringers bolus 1,500 mL   methylPREDNISolone sodium succinate (SOLU-MEDROL) 125 mg/2 mL injection 125 mg    IV methylprednisolone will be converted to either a q12h or q24h frequency with the same total daily dose (TDD).  Ordered Dose: 1 to 125 mg TDD; convert to: TDD q24h.  Ordered Dose: 126 to 250 mg TDD; convert to: TDD div q12h.  Ordered Dose: >250 mg TDD; DAW.   magnesium sulfate IVPB 2 g 50 mL   albuterol (PROVENTIL) (2.5 MG/3ML) 0.083% nebulizer solution 10 mg   cefTRIAXone (ROCEPHIN) 2 g in sodium chloride 0.9 % 100 mL IVPB    Order Specific Question:   Antibiotic Indication:    Answer:   CAP   azithromycin (ZITHROMAX) 500 mg in sodium chloride 0.9 % 250 mL IVPB    -I have reviewed the patients home medicines and have made adjustments as needed   Consultations Obtained: I requested consultation with the hospitalist,  and discussed lab and imaging findings as well as pertinent plan -  they  recommend: admission   Cardiac Monitoring: The patient was maintained on a cardiac monitor.  I personally viewed and interpreted the cardiac monitored which showed an underlying rhythm of: sinus tachycardia   Social Determinants of Health:  Diagnosis or treatment significantly limited by social determinants of health: former smoker   Reevaluation: After the interventions noted above, I reevaluated the patient and found that their symptoms have improved  Co morbidities that complicate the patient evaluation  Past Medical History:  Diagnosis Date   Emphysema lung (HCC)       Dispostion: Disposition decision including need for hospitalization was considered, and patient admitted to the hospital.    Final Clinical Impression(s) / ED Diagnoses Final diagnoses:  COPD exacerbation (HCC)  Community acquired pneumonia of left lower lobe of lung     This chart was dictated using voice recognition software.  Despite best efforts to proofread,  errors can occur which can change the documentation meaning.    Lonell Grandchild, MD 05/16/23 2328    Lonell Grandchild, MD 06/05/23 1146

## 2023-05-16 NOTE — ED Notes (Signed)
RT at bedside.

## 2023-05-16 NOTE — ED Notes (Signed)
 CCMD notified. Pt. Is on monitor.

## 2023-05-16 NOTE — Sepsis Progress Note (Signed)
Elink monitoring for the code sepsis protocol.  

## 2023-05-16 NOTE — ED Triage Notes (Signed)
Pt woke up this morning around 3am having a having hard time to breath. Pt reports that he used nebulizer and inhaler at home 3/4 times with no relief. Pt. Appears in distress and is in tripod position. Pt. Reports no chest pain.

## 2023-05-17 ENCOUNTER — Encounter (HOSPITAL_COMMUNITY): Payer: Self-pay | Admitting: Internal Medicine

## 2023-05-17 DIAGNOSIS — J189 Pneumonia, unspecified organism: Secondary | ICD-10-CM | POA: Diagnosis not present

## 2023-05-17 DIAGNOSIS — A419 Sepsis, unspecified organism: Secondary | ICD-10-CM | POA: Diagnosis present

## 2023-05-17 DIAGNOSIS — J441 Chronic obstructive pulmonary disease with (acute) exacerbation: Secondary | ICD-10-CM

## 2023-05-17 LAB — I-STAT CHEM 8, ED
BUN: 12 mg/dL (ref 8–23)
Calcium, Ion: 1.03 mmol/L — ABNORMAL LOW (ref 1.15–1.40)
Chloride: 103 mmol/L (ref 98–111)
Creatinine, Ser: 0.9 mg/dL (ref 0.61–1.24)
Glucose, Bld: 132 mg/dL — ABNORMAL HIGH (ref 70–99)
HCT: 50 % (ref 39.0–52.0)
Hemoglobin: 17 g/dL (ref 13.0–17.0)
Potassium: 3.8 mmol/L (ref 3.5–5.1)
Sodium: 136 mmol/L (ref 135–145)
TCO2: 20 mmol/L — ABNORMAL LOW (ref 22–32)

## 2023-05-17 LAB — RESPIRATORY PANEL BY PCR

## 2023-05-17 LAB — I-STAT CG4 LACTIC ACID, ED
Lactic Acid, Venous: 2.3 mmol/L (ref 0.5–1.9)
Lactic Acid, Venous: 2.6 mmol/L (ref 0.5–1.9)

## 2023-05-17 LAB — CBC
HCT: 42.3 % (ref 39.0–52.0)
Hemoglobin: 14.3 g/dL (ref 13.0–17.0)
MCH: 31.7 pg (ref 26.0–34.0)
MCHC: 33.8 g/dL (ref 30.0–36.0)
MCV: 93.8 fL (ref 80.0–100.0)
Platelets: 248 10*3/uL (ref 150–400)
RBC: 4.51 MIL/uL (ref 4.22–5.81)
RDW: 13.5 % (ref 11.5–15.5)
WBC: 19.6 10*3/uL — ABNORMAL HIGH (ref 4.0–10.5)
nRBC: 0 % (ref 0.0–0.2)

## 2023-05-17 LAB — PROCALCITONIN: Procalcitonin: 4.61 ng/mL

## 2023-05-17 LAB — PHOSPHORUS: Phosphorus: 1 mg/dL — CL (ref 2.5–4.6)

## 2023-05-17 LAB — BASIC METABOLIC PANEL
Anion gap: 9 (ref 5–15)
BUN: 11 mg/dL (ref 8–23)
CO2: 20 mmol/L — ABNORMAL LOW (ref 22–32)
Calcium: 8.3 mg/dL — ABNORMAL LOW (ref 8.9–10.3)
Chloride: 106 mmol/L (ref 98–111)
Creatinine, Ser: 1.02 mg/dL (ref 0.61–1.24)
GFR, Estimated: >60 mL/min (ref >60)
Glucose, Bld: 157 mg/dL — ABNORMAL HIGH (ref 70–99)
Potassium: 3.7 mmol/L (ref 3.5–5.1)
Sodium: 135 mmol/L (ref 135–145)

## 2023-05-17 LAB — MAGNESIUM: Magnesium: 2.4 mg/dL (ref 1.7–2.4)

## 2023-05-17 LAB — LACTIC ACID, PLASMA: Lactic Acid, Venous: 5 mmol/L (ref 0.5–1.9)

## 2023-05-17 LAB — BRAIN NATRIURETIC PEPTIDE: B Natriuretic Peptide: 150.4 pg/mL — ABNORMAL HIGH (ref 0.0–100.0)

## 2023-05-17 MED ORDER — BUDESON-GLYCOPYRROL-FORMOTEROL 160-9-4.8 MCG/ACT IN AERO
2.0000 | INHALATION_SPRAY | Freq: Two times a day (BID) | RESPIRATORY_TRACT | Status: DC
Start: 2023-05-17 — End: 2023-05-17

## 2023-05-17 MED ORDER — LEVALBUTEROL HCL 0.63 MG/3ML IN NEBU: 0.6300 mg | INHALATION_SOLUTION | RESPIRATORY_TRACT | Status: DC | PRN

## 2023-05-17 MED ORDER — IPRATROPIUM BROMIDE 0.02 % IN SOLN
0.5000 mg | RESPIRATORY_TRACT | Status: DC
  Administered 2023-05-17 (×4): 0.5 mg via RESPIRATORY_TRACT
  Filled 2023-05-17 (×4): qty 2.5

## 2023-05-17 MED ORDER — ENOXAPARIN SODIUM 40 MG/0.4ML IJ SOSY
40.0000 mg | PREFILLED_SYRINGE | INTRAMUSCULAR | Status: DC
  Administered 2023-05-17 – 2023-05-23 (×7): 40 mg via SUBCUTANEOUS
  Filled 2023-05-17 (×7): qty 0.4

## 2023-05-17 MED ORDER — SODIUM CHLORIDE 0.9 % IV SOLN
2.0000 g | INTRAVENOUS | Status: AC
  Administered 2023-05-17 – 2023-05-22 (×6): 2 g via INTRAVENOUS
  Filled 2023-05-17 (×6): qty 20

## 2023-05-17 MED ORDER — SODIUM CHLORIDE 0.9 % IV SOLN
500.0000 mg | INTRAVENOUS | Status: DC
  Administered 2023-05-17: 500 mg via INTRAVENOUS
  Filled 2023-05-17: qty 5

## 2023-05-17 MED ORDER — ORAL CARE MOUTH RINSE: 15.0000 mL | OROMUCOSAL | Status: DC | PRN

## 2023-05-17 MED ORDER — MELATONIN 5 MG PO TABS
5.0000 mg | ORAL_TABLET | Freq: Every evening | ORAL | Status: DC | PRN
  Administered 2023-05-21 – 2023-05-22 (×2): 5 mg via ORAL
  Filled 2023-05-17 (×2): qty 1

## 2023-05-17 MED ORDER — POTASSIUM PHOSPHATES 15 MMOLE/5ML IV SOLN
30.0000 mmol | Freq: Two times a day (BID) | INTRAVENOUS | Status: AC
  Administered 2023-05-17 (×2): 30 mmol via INTRAVENOUS
  Filled 2023-05-17 (×2): qty 10

## 2023-05-17 MED ORDER — IPRATROPIUM-ALBUTEROL 0.5-2.5 (3) MG/3ML IN SOLN
3.0000 mL | Freq: Four times a day (QID) | RESPIRATORY_TRACT | Status: DC
  Administered 2023-05-17: 3 mL via RESPIRATORY_TRACT

## 2023-05-17 MED ORDER — ARFORMOTEROL TARTRATE 15 MCG/2ML IN NEBU
15.0000 ug | INHALATION_SOLUTION | Freq: Two times a day (BID) | RESPIRATORY_TRACT | Status: DC
  Administered 2023-05-17 – 2023-05-23 (×12): 15 ug via RESPIRATORY_TRACT
  Filled 2023-05-17 (×14): qty 2

## 2023-05-17 MED ORDER — PROCHLORPERAZINE EDISYLATE 10 MG/2ML IJ SOLN: 5.0000 mg | Freq: Four times a day (QID) | INTRAMUSCULAR | Status: DC | PRN

## 2023-05-17 MED ORDER — METHYLPREDNISOLONE SODIUM SUCC 125 MG IJ SOLR
80.0000 mg | Freq: Every day | INTRAMUSCULAR | Status: DC
  Administered 2023-05-17 – 2023-05-22 (×6): 80 mg via INTRAVENOUS
  Filled 2023-05-17 (×6): qty 2

## 2023-05-17 MED ORDER — UMECLIDINIUM BROMIDE 62.5 MCG/ACT IN AEPB
1.0000 | INHALATION_SPRAY | Freq: Every day | RESPIRATORY_TRACT | Status: DC
  Filled 2023-05-17: qty 7

## 2023-05-17 MED ORDER — K PHOS MONO-SOD PHOS DI & MONO 155-852-130 MG PO TABS
500.0000 mg | ORAL_TABLET | Freq: Two times a day (BID) | ORAL | Status: AC
  Administered 2023-05-17 – 2023-05-21 (×10): 500 mg via ORAL
  Filled 2023-05-17 (×10): qty 2

## 2023-05-17 MED ORDER — GUAIFENESIN 100 MG/5ML PO LIQD
5.0000 mL | ORAL | Status: DC | PRN
  Administered 2023-05-21: 5 mL via ORAL
  Filled 2023-05-17: qty 15

## 2023-05-17 MED ORDER — ACETAMINOPHEN 325 MG PO TABS
650.0000 mg | ORAL_TABLET | Freq: Four times a day (QID) | ORAL | Status: DC | PRN
  Administered 2023-05-19 – 2023-05-22 (×3): 650 mg via ORAL
  Filled 2023-05-17 (×3): qty 2

## 2023-05-17 MED ORDER — POLYETHYLENE GLYCOL 3350 17 G PO PACK: 17.0000 g | PACK | Freq: Every day | ORAL | Status: DC | PRN

## 2023-05-17 MED ORDER — SODIUM CHLORIDE 0.9 % IV SOLN: INTRAVENOUS | Status: AC | PRN

## 2023-05-17 MED ORDER — FLUTICASONE FUROATE-VILANTEROL 200-25 MCG/ACT IN AEPB
1.0000 | INHALATION_SPRAY | Freq: Every day | RESPIRATORY_TRACT | Status: DC
  Filled 2023-05-17: qty 28

## 2023-05-17 MED ORDER — BUDESONIDE 0.25 MG/2ML IN SUSP
0.2500 mg | Freq: Two times a day (BID) | RESPIRATORY_TRACT | Status: DC
Start: 2023-05-17 — End: 2023-05-20
  Administered 2023-05-17 – 2023-05-20 (×6): 0.25 mg via RESPIRATORY_TRACT
  Filled 2023-05-17 (×7): qty 2

## 2023-05-17 MED ORDER — SODIUM CHLORIDE 0.9 % IV BOLUS
500.0000 mL | INTRAVENOUS | Status: AC
  Administered 2023-05-17: 500 mL via INTRAVENOUS

## 2023-05-17 MED ORDER — LEVALBUTEROL HCL 0.63 MG/3ML IN NEBU
0.6300 mg | INHALATION_SOLUTION | RESPIRATORY_TRACT | Status: DC
Start: 2023-05-17 — End: 2023-05-18
  Administered 2023-05-17 (×4): 0.63 mg via RESPIRATORY_TRACT
  Filled 2023-05-17 (×5): qty 3

## 2023-05-17 NOTE — ED Notes (Signed)
Date and time results received: 05/17/23 0513 (use smartphrase ".now" to insert current time)  Test: Phosphorus  Critical Value: 1.0  Name of Provider Notified: Dow Adolph DO  Orders Received? Or Actions Taken?:  MD to review & place orders

## 2023-05-17 NOTE — ED Notes (Signed)
Date and time results received: 05/17/23 0010 (use smartphrase ".now" to insert current time)  Test: Lactic Acid  Critical Value: 5.0  Name of Provider Notified: Dow Adolph DO  Orders Received? Or Actions Taken?:  MD to review & place orders

## 2023-05-17 NOTE — ED Notes (Signed)
Pt ambulated to the bathroom with tech.

## 2023-05-17 NOTE — Evaluation (Signed)
Occupational Therapy Evaluation Patient Details Name: Eugene Patel MRN: [id number] DOB: [birth year] Today's Date: 05/17/2023   History of Present Illness 72 y.o. male presents to the ED 12/3 with sudden onset shortness of breath, dx community-acquired pneumonia. PMHx: COPD, former tobacco user, quit 1 year ago.   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 On eval, pt with decreased activity tolerance, cardiopulmonary endurance, and balance. Performing ADL with mod I for increased time and rest breaks. Introduced IS and flutter valve and pt with good demo of use. Encouraged to use 10x/hour. Will follow acutely to optimize activity tolerance, but do not suspect need for follow up therapy after discharge.       If plan is discharge home, recommend the following: Other (comment) (on pt request)    Functional Status Assessment  Patient has had a recent decline in their functional status and demonstrates the ability to make significant improvements in function in a reasonable and predictable amount of time.  Equipment Recommendations  None recommended by OT    Recommendations for Other Services       Precautions / Restrictions Precautions Precautions: Fall Precaution Comments: monitor O2 Restrictions Weight Bearing Restrictions: No      Mobility Bed Mobility Overal bed mobility: Modified Independent             General bed mobility comments: extra time    Transfers Overall transfer level: Modified independent Equipment used: None               General transfer comment: Slow to rise but stable once upright.      Balance Overall balance assessment: Mild deficits observed, not formally tested                                         ADL either performed or assessed with clinical judgement   ADL Overall ADL's : Modified independent                                       General ADL  Comments: incresaed time and rest breaks needed     Vision Baseline Vision/History: 1 Wears glasses Ability to See in Adequate Light: 0 Adequate Patient Visual Report: No change from baseline Vision Assessment?: No apparent visual deficits     Perception         Praxis         Pertinent Vitals/Pain Pain Assessment Pain Assessment: Faces Faces Pain Scale: Hurts a little bit Pain Location: abdomen (muscles from coughing) Pain Descriptors / Indicators: Sore Pain Intervention(s): Limited activity within patient's tolerance, Monitored during session     Extremity/Trunk Assessment Upper Extremity Assessment Upper Extremity Assessment: Right hand dominant;Overall WFL for tasks assessed   Lower Extremity Assessment Lower Extremity Assessment: Generalized weakness       Communication Communication Communication: No apparent difficulties   Cognition Arousal: Alert Behavior During Therapy: WFL for tasks assessed/performed Overall Cognitive Status: Within Functional Limits for tasks assessed                                       General Comments  SpO2 as low as 85% with mobility needing incresaed time  to recover    Exercises Exercises: Other exercises Other Exercises Other Exercises: standing by sink reaching across body with RUE and then LUE Other Exercises: provided IS and flutter valve. Min cues for new leanring initially. Pt able to reach ~2000   Shoulder Instructions      Home Living Family/patient expects to be discharged to:: Private residence Living Arrangements: Spouse/significant other Available Help at Discharge: Family;Available 24 hours/day Type of Home: House Home Access: Stairs to enter [employer] of Steps: 3 Entrance Stairs-Rails: None Home Layout: Two level;Able to live on main level with bedroom/bathroom     Bathroom Shower/Tub: Tub/shower unit   Bathroom Toilet: Handicapped height Bathroom Accessibility: Yes    Home Equipment: Agricultural consultant (2 wheels);Cane - single point;Wheelchair - manual          Prior Functioning/Environment Prior Level of Function : Independent/Modified Independent;Driving;Working/employed             Mobility Comments: ESTS truck driver ADLs Comments: ind (wife does most cooking and cleaning)        OT Problem List: Decreased strength;Decreased activity tolerance;Impaired balance (sitting and/or standing);Cardiopulmonary status limiting activity      OT Treatment/Interventions: Self-care/ADL training;Therapeutic exercise;DME and/or AE instruction;Therapeutic activities;Cognitive remediation/compensation;Patient/family education;Balance training    OT Goals(Current goals can be found in the care plan section) Acute Rehab OT Goals Patient Stated Goal: go home OT Goal Formulation: With patient Time For Goal Achievement: 05/31/23 Potential to Achieve Goals: Good  OT Frequency: Min 1X/week    Co-evaluation              AM-PAC OT "6 Clicks" Daily Activity     Outcome Measure Help from another person eating meals?: None Help from another person taking care of personal grooming?: None Help from another person toileting, which includes using toliet, bedpan, or urinal?: None Help from another person bathing (including washing, rinsing, drying)?: None Help from another person to put on and taking off regular upper body clothing?: None Help from another person to put on and taking off regular lower body clothing?: None 6 Click Score: 24   End of Session Nurse Communication: Mobility status  Activity Tolerance: Patient tolerated treatment well Patient left: in bed;with call bell/phone within reach;with family/visitor present;with nursing/sitter in room  OT Visit Diagnosis: Unsteadiness on feet (R26.81);Other (comment) (decreased activity tolerance)                Time: 0865-7846 OT Time Calculation (min): 35 min Charges:  OT General Charges $OT Visit: 1  Visit OT Evaluation $OT Eval Moderate Complexity: 1 Mod OT Treatments $Self Care/Home Management : 8-22 mins  Tyler Deis, OTR/L [facility] Acute Rehabilitation Office: [phone]   Myrla Halsted 05/17/2023, 5:35 PM

## 2023-05-17 NOTE — Progress Notes (Signed)
   05/17/23 1541  Assess: MEWS Score  Temp 98.1 F (36.7 C)  BP 127/72  MAP (mmHg) 87  Pulse Rate (!) 106  Resp (!) 30  Level of Consciousness Alert  SpO2 96 %  O2 Device Room Air  Assess: MEWS Score  MEWS Temp 0  MEWS Systolic 0  MEWS Pulse 1  MEWS RR 2  MEWS LOC 0  MEWS Score 3  MEWS Score Color Yellow  Assess: if the MEWS score is Yellow or Red  Were vital signs accurate and taken at a resting state? Yes  Does the patient meet 2 or more of the SIRS criteria? Yes  Does the patient have a confirmed or suspected source of infection? Yes (Patient has an active diagnosis of Sepsis)  MEWS guidelines implemented  Yes, yellow  Treat  MEWS Interventions Considered administering scheduled or prn medications/treatments as ordered  Take Vital Signs  Increase Vital Sign Frequency  Yellow: Q2hr x1, continue Q4hrs until patient remains green for 12hrs  Escalate  MEWS: Escalate Yellow: Discuss with charge nurse and consider notifying provider and/or RRT  Notify: Charge Nurse/RN  Name of Charge Nurse/RN Notified Briana RN  Assess: SIRS CRITERIA  SIRS Temperature  0  SIRS Pulse 1  SIRS Respirations  1  SIRS WBC 1  SIRS Score Sum  3   Patient arrived from ED with yellow MEWS due to tachycardia and tachypnea. Patient admitted with Sepsis due to PNA. MEWS protocol initiated.

## 2023-05-17 NOTE — ED Notes (Signed)
ED TO INPATIENT HANDOFF REPORT  ED Nurse Name and Phone #: [phone] Eugene Patel Name/Age/Gender Eugene Patel 72 y.o. male Room/Bed: 010C/010C  Code Status   Code Status: Full Code  Home/SNF/Other Home Patient oriented to: self, place, time, and situation Is this baseline? Yes   Triage Complete: Triage complete  Chief Complaint CAP (community acquired pneumonia) [J18.9]  Triage Note Pt woke up this morning around 3am having a having hard time to breath. Pt reports that he used nebulizer and inhaler at home 3/4 times with no relief. Pt. Appears in distress and is in tripod position. Pt. Reports no chest pain.    Allergies No Known Allergies  Level of Care/Admitting Diagnosis ED Disposition     ED Disposition  Admit   Condition  --   Comment  Hospital Area: [facility] [[id number]]  Level of Care: Telemetry Medical [104]  May admit patient to [facility] or [facility] if equivalent level of care is available:: Yes  Covid Evaluation: Asymptomatic - no recent exposure (last 10 days) testing not required  Diagnosis: CAP (community acquired pneumonia) [416606]  Admitting Physician: Darlin Drop [[id number]]  Attending Physician: Darlin Drop [[id number]]  Certification:: I certify this patient will need inpatient services for at least 2 midnights  Expected Medical Readiness: 05/18/2023          B Medical/Surgery History Past Medical History:  Diagnosis Date   Emphysema lung (HCC)    Past Surgical History:  Procedure Laterality Date   CHOLECYSTECTOMY  2010     A IV Location/Drains/Wounds Patient Lines/Drains/Airways Status     Active Line/Drains/Airways     Name Placement date Placement time Site Days   Peripheral IV 05/16/23 20 G Right Antecubital 05/16/23  1910  Antecubital  1   Peripheral IV 05/16/23 20 G Left Antecubital 05/16/23  1950  Antecubital  1            Intake/Output Last 24 hours  Intake/Output Summary (Last 24 hours)  at 05/17/2023 1443 Last data filed at 05/17/2023 1400 Gross per 24 hour  Intake 2750 ml  Output --  Net 2750 ml    Labs/Imaging Results for orders placed or performed during the hospital encounter of 05/16/23 (from the past 48 hour(s))  Comprehensive metabolic panel     Status: Abnormal   Collection Time: 05/16/23  6:33 PM  Result Value Ref Range   Sodium 134 (L) 135 - 145 mmol/L   Potassium 3.8 3.5 - 5.1 mmol/L   Chloride 103 98 - 111 mmol/L   CO2 19 (L) 22 - 32 mmol/L   Glucose, Bld 127 (H) 70 - 99 mg/dL    Comment: Glucose reference range applies only to samples taken after fasting for at least 8 hours.   BUN 11 8 - 23 mg/dL   Creatinine, Ser 3.22 0.61 - 1.24 mg/dL   Calcium 8.9 8.9 - 02.5 mg/dL   Total Protein 6.6 6.5 - 8.1 g/dL   Albumin 3.6 3.5 - 5.0 g/dL   AST 26 15 - 41 U/L   ALT 18 0 - 44 U/L   Alkaline Phosphatase 68 38 - 126 U/L   Total Bilirubin 2.7 (H) <1.2 mg/dL   GFR, Estimated >42 >70 mL/min    Comment: (NOTE) Calculated using the CKD-EPI Creatinine Equation (2021)    Anion gap 12 5 - 15    Comment: Performed at [facility], [address], [place], [place] [address]  CBC with  Differential     Status: Abnormal   Collection Time: 05/16/23  6:33 PM  Result Value Ref Range   WBC 24.9 (H) 4.0 - 10.5 K/uL   RBC 5.14 4.22 - 5.81 MIL/uL   Hemoglobin 16.2 13.0 - 17.0 g/dL   HCT 03.4 74.2 - 59.5 %   MCV 91.8 80.0 - 100.0 fL   MCH 31.5 26.0 - 34.0 pg   MCHC 34.3 30.0 - 36.0 g/dL   RDW 63.8 75.6 - 43.3 %   Platelets 289 150 - 400 K/uL   nRBC 0.0 0.0 - 0.2 %   Neutrophils Relative % 87 %   Neutro Abs 21.9 (H) 1.7 - 7.7 K/uL   Lymphocytes Relative 4 %   Lymphs Abs 0.9 0.7 - 4.0 K/uL   Monocytes Relative 7 %   Monocytes Absolute 1.8 (H) 0.1 - 1.0 K/uL   Eosinophils Relative 0 %   Eosinophils Absolute 0.0 0.0 - 0.5 K/uL   Basophils Relative 1 %   Basophils Absolute 0.1 0.0 - 0.1 K/uL   Immature Granulocytes 1 %   Abs Immature Granulocytes 0.21 (H) 0.00  - 0.07 K/uL    Comment: Performed at [facility], [address], [place], [place] [address]  Protime-INR     Status: None   Collection Time: 05/16/23  6:33 PM  Result Value Ref Range   Prothrombin Time 14.3 11.4 - 15.2 seconds   INR 1.1 0.8 - 1.2    Comment: (NOTE) INR goal varies based on device and disease states. Performed at [facility], [address], [place], [place] [address]   APTT     Status: None   Collection Time: 05/16/23  6:33 PM  Result Value Ref Range   aPTT 29 24 - 36 seconds    Comment: Performed at [facility], [address], [place], [place] [address]  Blood Culture (routine x 2)     Status: None (Preliminary result)   Collection Time: 05/16/23  6:33 PM   Specimen: BLOOD RIGHT FOREARM  Result Value Ref Range   Specimen Description BLOOD RIGHT FOREARM    Special Requests      BOTTLES DRAWN AEROBIC AND ANAEROBIC Blood Culture results may not be optimal due to an inadequate volume of blood received in culture bottles   Culture      NO GROWTH < 24 HOURS Performed at [facility], [address] [place], [place] [address]    Report Status PENDING   I-Stat Lactic Acid, ED     Status: Abnormal   Collection Time: 05/16/23  6:39 PM  Result Value Ref Range   Lactic Acid, Venous 2.3 (HH) 0.5 - 1.9 mmol/L   Comment NOTIFIED PHYSICIAN   Resp panel by RT-PCR (RSV, Flu A&B, Covid) Anterior Nasal Swab     Status: None   Collection Time: 05/16/23  6:42 PM   Specimen: Anterior Nasal Swab  Result Value Ref Range   SARS Coronavirus 2 by RT PCR NEGATIVE NEGATIVE   Influenza A by PCR NEGATIVE NEGATIVE   Influenza B by PCR NEGATIVE NEGATIVE    Comment: (NOTE) The Xpert Xpress SARS-CoV-2/FLU/RSV plus assay is intended as an aid in the diagnosis of influenza from Nasopharyngeal swab specimens and should not be used as a sole basis for treatment. Nasal washings and aspirates are unacceptable for Xpert Xpress SARS-CoV-2/FLU/RSV testing.  Fact  Sheet for Patients: BloggerCourse.com  Fact Sheet for Healthcare Providers: SeriousBroker.it  This test is  not yet approved or cleared by the [place] and has been authorized for detection and/or diagnosis of SARS-CoV-2 by FDA under an Emergency Use Authorization (EUA). This EUA will remain in effect (meaning this test can be used) for the duration of the COVID-19 declaration under Section 564(b)(1) of the Act, 21 U.S.C. section 360bbb-3(b)(1), unless the authorization is terminated or revoked.     Resp Syncytial Virus by PCR NEGATIVE NEGATIVE    Comment: (NOTE) Fact Sheet for Patients: BloggerCourse.com  Fact Sheet for Healthcare Providers: SeriousBroker.it  This test is not yet approved or cleared by the [place] FDA and has been authorized for detection and/or diagnosis of SARS-CoV-2 by FDA under an Emergency Use Authorization (EUA). This EUA will remain in effect (meaning this test can be used) for the duration of the COVID-19 declaration under Section 564(b)(1) of the Act, 21 U.S.C. section 360bbb-3(b)(1), unless the authorization is terminated or revoked.  Performed at [facility], [address] [place], [place] [address]   Blood Culture (routine x 2)     Status: None (Preliminary result)   Collection Time: 05/16/23  6:42 PM   Specimen: BLOOD  Result Value Ref Range   Specimen Description BLOOD RIGHT ANTECUBITAL    Special Requests      BOTTLES DRAWN AEROBIC AND ANAEROBIC Blood Culture adequate volume   Culture      NO GROWTH < 24 HOURS Performed at [facility], [address] [place], [place] [address]    Report Status PENDING   I-stat chem 8, ed     Status: Abnormal   Collection Time: 05/16/23  6:43 PM  Result Value Ref Range   Sodium 136 135 - 145 mmol/L   Potassium 3.8 3.5 - 5.1 mmol/L   Chloride 103 98 - 111 mmol/L   BUN 12 8 -  23 mg/dL   Creatinine, Ser 2.53 0.61 - 1.24 mg/dL   Glucose, Bld 664 (H) 70 - 99 mg/dL    Comment: Glucose reference range applies only to samples taken after fasting for at least 8 hours.   Calcium, Ion 1.03 (L) 1.15 - 1.40 mmol/L   TCO2 20 (L) 22 - 32 mmol/L   Hemoglobin 17.0 13.0 - 17.0 g/dL   HCT 40.3 47.4 - 25.9 %  I-Stat Lactic Acid, ED     Status: Abnormal   Collection Time: 05/16/23  8:28 PM  Result Value Ref Range   Lactic Acid, Venous 4.3 (HH) 0.5 - 1.9 mmol/L   Comment NOTIFIED PHYSICIAN   I-stat chem 8, ED (not at [facility], [facility] or [facility])     Status: Abnormal   Collection Time: 05/16/23  8:32 PM  Result Value Ref Range   Sodium 133 (L) 135 - 145 mmol/L   Potassium 3.9 3.5 - 5.1 mmol/L   Chloride 100 98 - 111 mmol/L   BUN 12 8 - 23 mg/dL   Creatinine, Ser 5.63 0.61 - 1.24 mg/dL   Glucose, Bld 875 (H) 70 - 99 mg/dL    Comment: Glucose reference range applies only to samples taken after fasting for at least 8 hours.   Calcium, Ion 1.03 (L) 1.15 - 1.40 mmol/L   TCO2 21 (L) 22 - 32 mmol/L   Hemoglobin 13.9 13.0 - 17.0 g/dL   HCT 64.3 32.9 - 51.8 %  Urinalysis, w/ Reflex to Culture (Infection Suspected) -Urine, Clean Catch     Status: Abnormal   Collection Time: 05/16/23  9:42 PM  Result Value  Ref Range   Specimen Source URINE, CLEAN CATCH    Color, Urine STRAW (A) YELLOW   APPearance CLEAR CLEAR   Specific Gravity, Urine 1.030 1.005 - 1.030   pH 6.0 5.0 - 8.0   Glucose, UA NEGATIVE NEGATIVE mg/dL   Hgb urine dipstick SMALL (A) NEGATIVE   Bilirubin Urine NEGATIVE NEGATIVE   Ketones, ur NEGATIVE NEGATIVE mg/dL   Protein, ur NEGATIVE NEGATIVE mg/dL   Nitrite NEGATIVE NEGATIVE   Leukocytes,Ua NEGATIVE NEGATIVE   RBC / HPF 0-5 0 - 5 RBC/hpf   WBC, UA 0-5 0 - 5 WBC/hpf    Comment:        Reflex urine culture not performed if WBC <=10, OR if Squamous epithelial cells >5. If Squamous epithelial cells >5 suggest recollection.    Bacteria, UA NONE SEEN NONE SEEN   Squamous  Epithelial / HPF 0-5 0 - 5 /HPF    Comment: Performed at [facility], [address], [place], [place] [address]  Lactic acid, plasma     Status: Abnormal   Collection Time: 05/16/23 11:35 PM  Result Value Ref Range   Lactic Acid, Venous 5.0 (HH) 0.5 - 1.9 mmol/L    Comment: CRITICAL RESULT CALLED TO, READ BACK BY AND VERIFIED WITH Henriette Combs RN [phone] [facility] Performed at [facility], [address], [place], [place] [address]   CBC     Status: Abnormal   Collection Time: 05/17/23  3:58 AM  Result Value Ref Range   WBC 19.6 (H) 4.0 - 10.5 K/uL   RBC 4.51 4.22 - 5.81 MIL/uL   Hemoglobin 14.3 13.0 - 17.0 g/dL   HCT 52.8 41.3 - 24.4 %   MCV 93.8 80.0 - 100.0 fL   MCH 31.7 26.0 - 34.0 pg   MCHC 33.8 30.0 - 36.0 g/dL   RDW 01.0 27.2 - 53.6 %   Platelets 248 150 - 400 K/uL   nRBC 0.0 0.0 - 0.2 %    Comment: Performed at [facility], [address] [place], [place] [address]  Basic metabolic panel     Status: Abnormal   Collection Time: 05/17/23  3:58 AM  Result Value Ref Range   Sodium 135 135 - 145 mmol/L   Potassium 3.7 3.5 - 5.1 mmol/L   Chloride 106 98 - 111 mmol/L   CO2 20 (L) 22 - 32 mmol/L   Glucose, Bld 157 (H) 70 - 99 mg/dL    Comment: Glucose reference range applies only to samples taken after fasting for at least 8 hours.   BUN 11 8 - 23 mg/dL   Creatinine, Ser 4.74 0.61 - 1.24 mg/dL   Calcium 8.3 (L) 8.9 - 10.3 mg/dL   GFR, Estimated >25 >95 mL/min    Comment: (NOTE) Calculated using the CKD-EPI Creatinine Equation (2021)    Anion gap 9 5 - 15    Comment: Performed at [facility], [address] [place], [place] [address]  Magnesium     Status: None   Collection Time: 05/17/23  3:58 AM  Result Value Ref Range   Magnesium 2.4 1.7 - 2.4 mg/dL    Comment: Performed at [facility], [address] [place], [place] [address]  Phosphorus     Status: Abnormal   Collection Time: 05/17/23  3:58 AM  Result Value Ref Range    Phosphorus 1.0 (LL) 2.5 - 4.6 mg/dL    Comment: CRITICAL RESULT CALLED TO,  READ BACK BY AND VERIFIED WITH S.JOHNSON RN [phone] M. [facility] Performed at [facility], [address] [place], [place] [address]   Procalcitonin     Status: None   Collection Time: 05/17/23  6:59 AM  Result Value Ref Range   Procalcitonin 4.61 ng/mL    Comment:        Interpretation: PCT > 2 ng/mL: Systemic infection (sepsis) is likely, unless other causes are known. (NOTE)       Sepsis PCT Algorithm           Lower Respiratory Tract                                      Infection PCT Algorithm    ----------------------------     ----------------------------         PCT < 0.25 ng/mL                PCT < 0.10 ng/mL          Strongly encourage             Strongly discourage   discontinuation of antibiotics    initiation of antibiotics    ----------------------------     -----------------------------       PCT 0.25 - 0.50 ng/mL            PCT 0.10 - 0.25 ng/mL               OR       >80% decrease in PCT            Discourage initiation of                                            antibiotics      Encourage discontinuation           of antibiotics    ----------------------------     -----------------------------         PCT >= 0.50 ng/mL              PCT 0.26 - 0.50 ng/mL               AND       <80% decrease in PCT              Encourage initiation of                                             antibiotics       Encourage continuation           of antibiotics    ----------------------------     -----------------------------        PCT >= 0.50 ng/mL                  PCT > 0.50 ng/mL               AND         increase in PCT                  Strongly encourage  initiation of antibiotics    Strongly encourage escalation           of antibiotics                                     -----------------------------                                           PCT  <= 0.25 ng/mL                                                 OR                                        > 80% decrease in PCT                                      Discontinue / Do not initiate                                             antibiotics  Performed at [facility], [address], [place], [place] [address]   Brain natriuretic peptide     Status: Abnormal   Collection Time: 05/17/23  8:43 AM  Result Value Ref Range   B Natriuretic Peptide 150.4 (H) 0.0 - 100.0 pg/mL    Comment: Performed at [facility], [address] [place], [place] [address]   CT Angio Chest PE W/Cm &/Or Wo Cm  Result Date: 05/16/2023 CLINICAL DATA:  Pulmonary embolus suspected with high probability. EXAM: CT ANGIOGRAPHY CHEST WITH CONTRAST TECHNIQUE: Multidetector CT imaging of the chest was performed using the standard protocol during bolus administration of intravenous contrast. Multiplanar CT image reconstructions and MIPs were obtained to evaluate the vascular anatomy. RADIATION DOSE REDUCTION: This exam was performed according to the departmental dose-optimization program which includes automated exposure control, adjustment of the mA and/or kV according to patient size and/or use of iterative reconstruction technique. CONTRAST:  75mL OMNIPAQUE IOHEXOL 350 MG/ML SOLN COMPARISON:  Chest radiograph 05/16/2023.  CT chest 05/23/2022 FINDINGS: Cardiovascular: Technically adequate study with good opacification of the central and segmental pulmonary arteries and mild motion artifact. No focal filling defects are seen. No evidence of significant pulmonary embolus. Normal heart size. No pericardial effusions. Normal caliber thoracic aorta. Minimal calcification in the aorta and coronary arteries. Mediastinum/Nodes: No enlarged mediastinal, hilar, or axillary lymph nodes. Thyroid gland, trachea, and esophagus demonstrate no significant findings. Lungs/Pleura: Severe emphysematous changes in the lungs.  Nodular scarring in the right apex is unchanged. New interstitial infiltrates with ground-glass in the left lung base could represent superimposed pneumonia or possibly developing interstitial lung disease. Calcified granulomas. No pleural effusions. No pneumothorax. Upper Abdomen: Multiple cysts in the liver without change. No imaging follow-up is indicated. Surgical absence of the gallbladder. No acute abnormalities are suggested. Musculoskeletal: Degenerative changes  in the spine. No acute bony abnormalities. Review of the MIP images confirms the above findings. IMPRESSION: 1. No evidence of significant pulmonary embolus. 2. Severe emphysematous changes throughout the lungs. Chronic scarring in the right apex. 3. New interstitial and alveolar infiltrates in the left lung base probably representing pneumonia. Less likely developing interstitial lung disease with acute alveolitis. Electronically Signed   By: Burman Nieves M.D.   On: 05/16/2023 21:35   DG Chest 2 View  Result Date: 05/16/2023 CLINICAL DATA:  Possible sepsis cough and chest pain EXAM: CHEST - 2 VIEW COMPARISON:  07/18/2022 FINDINGS: Hyperinflation with emphysema ground-glass opacity at the left peripheral lung base. No pleural effusion. Normal cardiomediastinal silhouette. No pneumothorax IMPRESSION: Hyperinflation with emphysema. Ground-glass opacity at the left peripheral lung base, possible pneumonia. Electronically Signed   By: Jasmine Pang M.D.   On: 05/16/2023 20:21    Pending Labs Unresulted Labs (From admission, onward)     Start     Ordered   05/24/23 0500  Creatinine, serum  (enoxaparin (LOVENOX)    CrCl >/= 30 ml/min)  Weekly,   R     Comments: while on enoxaparin therapy    05/17/23 0037   05/18/23 0500  Basic metabolic panel  Daily,   R      05/17/23 0637   05/18/23 0500  Phosphorus  Daily,   R      05/17/23 0637   05/18/23 0500  CBC  Daily,   R      05/17/23 0637   05/17/23 0638  Respiratory (~20 pathogens) panel  by PCR  (Respiratory panel by PCR (~20 pathogens, ~24 hr TAT)  w precautions)  Once,   R        05/17/23 0637            Vitals/Pain Today's Vitals   05/17/23 1300 05/17/23 1315 05/17/23 1330 05/17/23 1345  BP: 124/68 114/72 112/68 117/70  Pulse: 71 (!) 110 (!) 102 (!) 134  Resp: (!) 26 19 16  (!) 24  Temp:      TempSrc:      SpO2: (!) 88% 100% 98% 100%  PainSc:        Isolation Precautions Droplet precaution  Medications Medications  cefTRIAXone (ROCEPHIN) 2 g in sodium chloride 0.9 % 100 mL IVPB (has no administration in time range)  azithromycin (ZITHROMAX) 500 mg in sodium chloride 0.9 % 250 mL IVPB (has no administration in time range)  guaiFENesin (ROBITUSSIN) 100 MG/5ML liquid 5 mL (has no administration in time range)  enoxaparin (LOVENOX) injection 40 mg (40 mg Subcutaneous Given 05/17/23 0918)  acetaminophen (TYLENOL) tablet 650 mg (has no administration in time range)  prochlorperazine (COMPAZINE) injection 5 mg (has no administration in time range)  melatonin tablet 5 mg (has no administration in time range)  polyethylene glycol (MIRALAX / GLYCOLAX) packet 17 g (has no administration in time range)  potassium PHOSPHATE 30 mmol in dextrose 5 % 500 mL infusion (0 mmol Intravenous Stopped 05/17/23 1400)  phosphorus (K PHOS NEUTRAL) tablet 500 mg (500 mg Oral Given 05/17/23 1025)  methylPREDNISolone sodium succinate (SOLU-MEDROL) 125 mg/2 mL injection 80 mg (80 mg Intravenous Given 05/17/23 0918)  levalbuterol (XOPENEX) nebulizer solution 0.63 mg (0.63 mg Nebulization Given 05/17/23 1150)    And  ipratropium (ATROVENT) nebulizer solution 0.5 mg (0.5 mg Nebulization Given 05/17/23 1150)  budesonide (PULMICORT) nebulizer solution 0.25 mg (0.25 mg Nebulization Given 05/17/23 0852)  arformoterol (BROVANA) nebulizer solution 15 mcg (15 mcg Nebulization Given 05/17/23  9381)  levalbuterol (XOPENEX) nebulizer solution 0.63 mg (has no administration in time range)   ipratropium-albuterol (DUONEB) 0.5-2.5 (3) MG/3ML nebulizer solution 3 mL (3 mLs Nebulization Given 05/16/23 1826)  iohexol (OMNIPAQUE) 350 MG/ML injection 75 mL (75 mLs Intravenous Contrast Given 05/16/23 2034)  lactated ringers bolus 1,500 mL (0 mLs Intravenous Stopped 05/16/23 2350)  methylPREDNISolone sodium succinate (SOLU-MEDROL) 125 mg/2 mL injection 125 mg (125 mg Intravenous Given 05/16/23 1943)  magnesium sulfate IVPB 2 g 50 mL (0 g Intravenous Stopped 05/16/23 2057)  albuterol (PROVENTIL) (2.5 MG/3ML) 0.083% nebulizer solution 10 mg (10 mg Nebulization Given 05/16/23 2035)  cefTRIAXone (ROCEPHIN) 2 g in sodium chloride 0.9 % 100 mL IVPB (0 g Intravenous Stopped 05/16/23 2201)  azithromycin (ZITHROMAX) 500 mg in sodium chloride 0.9 % 250 mL IVPB (0 mg Intravenous Stopped 05/16/23 2349)  sodium chloride 0.9 % bolus 500 mL (0 mLs Intravenous Stopped 05/17/23 0245)    Mobility Walks - SHOB  with walking     Focused Assessments Pulmonary Assessment Handoff:  Lung sounds: Bilateral Breath Sounds: Diminished O2 Device: Room Air      R Recommendations: See Admitting Provider Note  Report given to:   Additional Notes:

## 2023-05-17 NOTE — Progress Notes (Signed)
[facility]                                                                              Eugene Patel, is a 72 y.o. male, DOB - [birth year], FAO:[id number] Admit date - 05/16/2023    Outpatient Primary MD for the patient is Tillman Abide I, MD  LOS - 1  days  Chief Complaint  Patient presents with   Shortness of Breath            Brief summary   Patient is a 72 year old male with COPD, former tobacco use, quit 1 year ago presented to ED with sudden onset of shortness of breath around 3 AM on the day of admission, no chest pain.  In ED, was noted to be tripoding, chest x-ray showed hyperinflation with emphysema, groundglass opacities in the left peripheral lung base possible pneumonia.  Patient was placed on empiric IV antibiotics. In ED, temp 97.7 F, BP 90/58, pulse 96, O2 sats 90% on room air, RR 26   Assessment & Plan    Principal Problem:   Sepsis (HCC), community-acquired pneumonia -Patient met Sepsis criteria due to borderline BP, tachycardia, leukocytosis, lactic acidosis, elevated procalcitonin, source likely due to community-acquired pneumonia -CTA negative for PE. -COVID-negative, flu, RSV negative respiratory virus panel negative -Follow blood cultures -Obtain urine Legionella antigen, urine strep antigen -Continue IV Zithromax, Rocephin -BNP 150.4, obtain 2D echo, no prior cardiac workup -Lactic acid 5.0 overnight, improving to 2.6 today  Active Problems:   COPD (chronic obstructive pulmonary disease) with emphysema (HCC) -Noted to have wheezing, continue Xopenex, Atrovent nebs every 4 hours, Pulmicort, Brovana -Continue IV Solu-Medrol 80 mg daily -Home O2 evaluation prior to discharge -Quit smoking 1 year ago  Hypophosphatemia -Placed on K-Phos IV x 1 and Neutra-Phos twice daily  Mild hypovolemic hyponatremia -Received IV fluid hydration, improved to 135   Estimated body mass index is 20.02 kg/m as calculated from the following:    Height as of 05/01/23: 6' (1.829 m).   Weight as of 05/01/23: 67 kg.  Code Status: Full code DVT Prophylaxis:  enoxaparin (LOVENOX) injection 40 mg Start: 05/17/23 1000   Level of Care: Level of care: Telemetry Medical Family Communication: Updated patient's wife at the bedside Disposition Plan:      Remains inpatient appropriate:     Procedures:  None  Consultants:   None  Antimicrobials:   Anti-infectives (From admission, onward)    Start     Dose/Rate Route Frequency Ordered Stop   05/17/23 1830  azithromycin (ZITHROMAX) 500 mg in sodium chloride 0.9 % 250 mL IVPB        500 mg 250 mL/hr over 60 Minutes Intravenous Every 24 hours 05/17/23 0036     05/17/23 1800  cefTRIAXone (ROCEPHIN) 2 g in sodium chloride 0.9 % 100 mL IVPB        2 g 200 mL/hr over 30 Minutes Intravenous Every 24 hours 05/17/23 0036     05/16/23 2130  cefTRIAXone (ROCEPHIN) 2 g in sodium chloride 0.9 % 100 mL IVPB        2 g 200 mL/hr over 30 Minutes Intravenous  Once  05/16/23 2117 05/16/23 2201   05/16/23 2130  azithromycin (ZITHROMAX) 500 mg in sodium chloride 0.9 % 250 mL IVPB        500 mg 250 mL/hr over 60 Minutes Intravenous  Once 05/16/23 2117 05/16/23 2349          Medications  arformoterol  15 mcg Nebulization BID   budesonide (PULMICORT) nebulizer solution  0.25 mg Nebulization BID   enoxaparin (LOVENOX) injection  40 mg Subcutaneous Q24H   levalbuterol  0.63 mg Nebulization Q4H   And   ipratropium  0.5 mg Nebulization Q4H   methylPREDNISolone (SOLU-MEDROL) injection  80 mg Intravenous Daily   phosphorus  500 mg Oral BID      Subjective:   Eugene Patel was seen and examined today.  Still feeling short of breath, wheezing, no chest pain abdominal pain nausea vomiting.  No cough, fevers.  Objective:   Vitals:   05/17/23 1415 05/17/23 1430 05/17/23 1500 05/17/23 1541  BP: 118/75 124/75 110/77 127/72  Pulse: (!) 106 (!) 111 (!) 106 (!) 106  Resp: 20 (!) 26 (!) 22 (!) 30   Temp:      TempSrc:      SpO2: 97% 98% 96% 96%    Intake/Output Summary (Last 24 hours) at 05/17/2023 1625 Last data filed at 05/17/2023 1400 Gross per 24 hour  Intake 2750 ml  Output --  Net 2750 ml     Wt Readings from Last 3 Encounters:  05/01/23 67 kg  08/22/22 66.2 kg  07/18/22 67.3 kg     Exam General: Alert and oriented x 3, NAD Cardiovascular: S1 S2 auscultated,  RRR Respiratory: Diminished breath sounds throughout with faint wheezing Gastrointestinal: Soft, nontender, nondistended, + bowel sounds Ext: no pedal edema bilaterally Neuro: no new deficits Psych: Normal affect     Data Reviewed:  I have personally reviewed following labs    CBC Lab Results  Component Value Date   WBC 19.6 (H) 05/17/2023   RBC 4.51 05/17/2023   HGB 14.3 05/17/2023   HCT 42.3 05/17/2023   MCV 93.8 05/17/2023   MCH 31.7 05/17/2023   PLT 248 05/17/2023   MCHC 33.8 05/17/2023   RDW 13.5 05/17/2023   LYMPHSABS 0.9 05/16/2023   MONOABS 1.8 (H) 05/16/2023   EOSABS 0.0 05/16/2023   BASOSABS 0.1 05/16/2023     Last metabolic panel Lab Results  Component Value Date   NA 135 05/17/2023   K 3.7 05/17/2023   CL 106 05/17/2023   CO2 20 (L) 05/17/2023   BUN 11 05/17/2023   CREATININE 1.02 05/17/2023   GLUCOSE 157 (H) 05/17/2023   GFRNONAA >60 05/17/2023   GFRAA  02/21/2009    >60        The eGFR has been calculated using the MDRD equation. This calculation has not been validated in all clinical situations. eGFR's persistently <60 mL/min signify possible Chronic Kidney Disease.   CALCIUM 8.3 (L) 05/17/2023   PHOS 1.0 (LL) 05/17/2023   PROT 6.6 05/16/2023   ALBUMIN 3.6 05/16/2023   BILITOT 2.7 (H) 05/16/2023   ALKPHOS 68 05/16/2023   AST 26 05/16/2023   ALT 18 05/16/2023   ANIONGAP 9 05/17/2023    CBG (last 3)  No results for input(s): "GLUCAP" in the last 72 hours.    Coagulation Profile: Recent Labs  Lab 05/16/23 1833  INR 1.1     Radiology Studies:  I have personally reviewed the imaging studies  CT Angio Chest PE W/Cm &/Or Wo Cm  Result Date: 05/16/2023 CLINICAL DATA:  Pulmonary embolus suspected with high probability. EXAM: CT ANGIOGRAPHY CHEST WITH CONTRAST TECHNIQUE: Multidetector CT imaging of the chest was performed using the standard protocol during bolus administration of intravenous contrast. Multiplanar CT image reconstructions and MIPs were obtained to evaluate the vascular anatomy. RADIATION DOSE REDUCTION: This exam was performed according to the departmental dose-optimization program which includes automated exposure control, adjustment of the mA and/or kV according to patient size and/or use of iterative reconstruction technique. CONTRAST:  75mL OMNIPAQUE IOHEXOL 350 MG/ML SOLN COMPARISON:  Chest radiograph 05/16/2023.  CT chest 05/23/2022 FINDINGS: Cardiovascular: Technically adequate study with good opacification of the central and segmental pulmonary arteries and mild motion artifact. No focal filling defects are seen. No evidence of significant pulmonary embolus. Normal heart size. No pericardial effusions. Normal caliber thoracic aorta. Minimal calcification in the aorta and coronary arteries. Mediastinum/Nodes: No enlarged mediastinal, hilar, or axillary lymph nodes. Thyroid gland, trachea, and esophagus demonstrate no significant findings. Lungs/Pleura: Severe emphysematous changes in the lungs. Nodular scarring in the right apex is unchanged. New interstitial infiltrates with ground-glass in the left lung base could represent superimposed pneumonia or possibly developing interstitial lung disease. Calcified granulomas. No pleural effusions. No pneumothorax. Upper Abdomen: Multiple cysts in the liver without change. No imaging follow-up is indicated. Surgical absence of the gallbladder. No acute abnormalities are suggested. Musculoskeletal: Degenerative changes in the spine. No acute bony abnormalities. Review of the MIP images confirms  the above findings. IMPRESSION: 1. No evidence of significant pulmonary embolus. 2. Severe emphysematous changes throughout the lungs. Chronic scarring in the right apex. 3. New interstitial and alveolar infiltrates in the left lung base probably representing pneumonia. Less likely developing interstitial lung disease with acute alveolitis. Electronically Signed   By: Burman Nieves M.D.   On: 05/16/2023 21:35   DG Chest 2 View  Result Date: 05/16/2023 CLINICAL DATA:  Possible sepsis cough and chest pain EXAM: CHEST - 2 VIEW COMPARISON:  07/18/2022 FINDINGS: Hyperinflation with emphysema ground-glass opacity at the left peripheral lung base. No pleural effusion. Normal cardiomediastinal silhouette. No pneumothorax IMPRESSION: Hyperinflation with emphysema. Ground-glass opacity at the left peripheral lung base, possible pneumonia. Electronically Signed   By: Jasmine Pang M.D.   On: 05/16/2023 20:21       Syerra Abdelrahman M.D. [facility] 05/17/2023, 4:25 PM  Available via Epic secure chat 7am-7pm After 7 pm, please refer to night coverage provider listed on amion.

## 2023-05-17 NOTE — H&P (Signed)
History and Physical  Eugene Patel ZOX:[id number] DOB: 1950-12-28 DOA: 05/16/2023  Referring physician: Dr. Suezanne Jacquet, EDP  PCP: Karie Schwalbe, MD  Outpatient Specialists: None Patient coming from: Home  Chief Complaint: Shortness of breath.  HPI: Eugene Patel is a 72 y.o. male with medical history significant for COPD, former tobacco user, quit 1 year ago, who presents to the ED with sudden onset shortness of breath around 3 AM.  Denies having any chest pain.  In the ED, noted to be tripoding.  A chest x-ray was done and it revealed hyperinflation with emphysema, groundglass opacity in the left peripheral lung base possible pneumonia.  The patient was started on empiric IV antibiotics.  TRH, hospitalist service, was asked to admit.  ED Course: Temperature 97.7.  BP 90/58, pulse 96, respiratory 26, O2 saturation 90% on room air.  Review of Systems: Review of systems as noted in the HPI. All other systems reviewed and are negative.   Past Medical History:  Diagnosis Date   Emphysema lung (HCC)    Past Surgical History:  Procedure Laterality Date   CHOLECYSTECTOMY  2010    Social History:  reports that he quit smoking about 2 years ago. His smoking use included cigarettes. He started smoking about 54 years ago. He has a 78 pack-year smoking history. He has been exposed to tobacco smoke. He has never used smokeless tobacco. He reports that he does not drink alcohol and does not use drugs.   No Known Allergies  Family History  Problem Relation Age of Onset   Diabetes Father    Diabetes Other    Cancer Neg Hx    Heart disease Neg Hx    Hypertension Neg Hx       Prior to Admission medications   Medication Sig Start Date End Date Taking? Authorizing Provider  albuterol (PROVENTIL) (2.5 MG/3ML) 0.083% nebulizer solution TAKE 3 MLS BY NEBULIZATION EVERY 6 HOURSAS NEEDED FOR WHEEZING OR SHORTNESS OF BREATH 07/25/22  Yes Salena Saner, MD  albuterol (VENTOLIN HFA)  108 (90 Base) MCG/ACT inhaler INHALE 2 PUFFS INTO THE LUNGS 3 TIMES DAILY AS NEEDED 07/15/22  Yes Karie Schwalbe, MD  BREZTRI AEROSPHERE 160-9-4.8 MCG/ACT AERO INHALE TWO PUFFS INTO THE LUNGS IN THE MORNING AND AT BEDTIME 11/10/22  Yes Salena Saner, MD  diphenhydramine-acetaminophen (TYLENOL PM) 25-500 MG TABS tablet Take 1 tablet by mouth at bedtime as needed.   Yes [provider]  Ensifentrine (OHTUVAYRE) 3 MG/2.5ML SUSP Take 3 mg by nebulization 2 (two) times daily. 05/01/23  Yes Salena Saner, MD  ipratropium-albuterol (DUONEB) 0.5-2.5 (3) MG/3ML SOLN Take 3 mLs by nebulization 4 (four) times daily. 02/28/22  Yes Salena Saner, MD  triamcinolone cream (KENALOG) 0.1 % APPLY TOPICALLY TWICE DAILY AS NEEDED. 01/03/22  Yes Karie Schwalbe, MD    Physical Exam: BP 109/73   Pulse 97   Temp (!) 97.4 F (36.3 C) (Oral)   Resp (!) 24   SpO2 96%   General: 72 y.o. year-old male well developed well nourished in no acute distress.  Alert and oriented x3. Cardiovascular: Regular rate and rhythm with no rubs or gallops.  No thyromegaly or JVD noted.  No lower extremity edema. 2/4 pulses in all 4 extremities. Respiratory: Clear to auscultation with no wheezes or rales. Good inspiratory effort. Abdomen: Soft nontender nondistended with normal bowel sounds x4 quadrants. Muskuloskeletal: No cyanosis, clubbing or edema noted bilaterally Neuro: CN II-XII intact, strength, sensation, reflexes Skin:  No ulcerative lesions noted or rashes Psychiatry: Judgement and insight appear normal. Mood is appropriate for condition and setting          Labs on Admission:  Basic Metabolic Panel: Recent Labs  Lab 05/16/23 1833 05/16/23 2032  NA 134* 133*  K 3.8 3.9  CL 103 100  CO2 19*  --   GLUCOSE 127* 114*  BUN 11 12  CREATININE 0.99 1.00  CALCIUM 8.9  --    Liver Function Tests: Recent Labs  Lab 05/16/23 1833  AST 26  ALT 18  ALKPHOS 68  BILITOT 2.7*  PROT 6.6  ALBUMIN  3.6   No results for input(s): "LIPASE", "AMYLASE" in the last 168 hours. No results for input(s): "AMMONIA" in the last 168 hours. CBC: Recent Labs  Lab 05/16/23 1833 05/16/23 2032  WBC 24.9*  --   NEUTROABS 21.9*  --   HGB 16.2 13.9  HCT 47.2 41.0  MCV 91.8  --   PLT 289  --    Cardiac Enzymes: No results for input(s): "CKTOTAL", "CKMB", "CKMBINDEX", "TROPONINI" in the last 168 hours.  BNP (last 3 results) No results for input(s): "BNP" in the last 8760 hours.  ProBNP (last 3 results) No results for input(s): "PROBNP" in the last 8760 hours.  CBG: No results for input(s): "GLUCAP" in the last 168 hours.  Radiological Exams on Admission: CT Angio Chest PE W/Cm &/Or Wo Cm  Result Date: 05/16/2023 CLINICAL DATA:  Pulmonary embolus suspected with high probability. EXAM: CT ANGIOGRAPHY CHEST WITH CONTRAST TECHNIQUE: Multidetector CT imaging of the chest was performed using the standard protocol during bolus administration of intravenous contrast. Multiplanar CT image reconstructions and MIPs were obtained to evaluate the vascular anatomy. RADIATION DOSE REDUCTION: This exam was performed according to the departmental dose-optimization program which includes automated exposure control, adjustment of the mA and/or kV according to patient size and/or use of iterative reconstruction technique. CONTRAST:  75mL OMNIPAQUE IOHEXOL 350 MG/ML SOLN COMPARISON:  Chest radiograph 05/16/2023.  CT chest 05/23/2022 FINDINGS: Cardiovascular: Technically adequate study with good opacification of the central and segmental pulmonary arteries and mild motion artifact. No focal filling defects are seen. No evidence of significant pulmonary embolus. Normal heart size. No pericardial effusions. Normal caliber thoracic aorta. Minimal calcification in the aorta and coronary arteries. Mediastinum/Nodes: No enlarged mediastinal, hilar, or axillary lymph nodes. Thyroid gland, trachea, and esophagus demonstrate no  significant findings. Lungs/Pleura: Severe emphysematous changes in the lungs. Nodular scarring in the right apex is unchanged. New interstitial infiltrates with ground-glass in the left lung base could represent superimposed pneumonia or possibly developing interstitial lung disease. Calcified granulomas. No pleural effusions. No pneumothorax. Upper Abdomen: Multiple cysts in the liver without change. No imaging follow-up is indicated. Surgical absence of the gallbladder. No acute abnormalities are suggested. Musculoskeletal: Degenerative changes in the spine. No acute bony abnormalities. Review of the MIP images confirms the above findings. IMPRESSION: 1. No evidence of significant pulmonary embolus. 2. Severe emphysematous changes throughout the lungs. Chronic scarring in the right apex. 3. New interstitial and alveolar infiltrates in the left lung base probably representing pneumonia. Less likely developing interstitial lung disease with acute alveolitis. Electronically Signed   By: Burman Nieves M.D.   On: 05/16/2023 21:35   DG Chest 2 View  Result Date: 05/16/2023 CLINICAL DATA:  Possible sepsis cough and chest pain EXAM: CHEST - 2 VIEW COMPARISON:  07/18/2022 FINDINGS: Hyperinflation with emphysema ground-glass opacity at the left peripheral lung base. No pleural effusion.  Normal cardiomediastinal silhouette. No pneumothorax IMPRESSION: Hyperinflation with emphysema. Ground-glass opacity at the left peripheral lung base, possible pneumonia. Electronically Signed   By: Jasmine Pang M.D.   On: 05/16/2023 20:21    EKG: I independently viewed the EKG done and my findings are as followed: Sinus tachycardia rate of 82.  Nonspecific ST-T changes.  QTc 431.  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 Nicotine patch as needed  Mild hypovolemic hyponatremia Serum sodium 133 Repleted with NS at 50 cc/h x 1 day.   Time: 75 minutes.   DVT prophylaxis: Subcu Lovenox daily  Code Status: Full code.  Family Communication: None at bedside.  Disposition Plan: Admitted to telemetry medical unit  Consults called: None.  Admission status: Inpatient status.   Status is: Inpatient The patient requires at least 2 midnights for further evaluation and treatment of present condition.   Darlin Drop MD [facility] Pager [phone]  If 7PM-7AM, please contact night-coverage [email or web address] Password [email or web address]  05/17/2023, 12:40 AM

## 2023-05-17 NOTE — Evaluation (Signed)
Physical Therapy Evaluation Patient Details Name: Eugene Patel MRN: [id number] DOB: 12/15/1950 Today's Date: 05/17/2023  History of Present Illness  72 y.o. male presents to the ED 12/3 with sudden onset shortness of breath, dx community-acquired pneumonia. PMHx: COPD, former tobacco user, quit 1 year ago.   Clinical Impression  Pt admitted with above diagnosis. PTA, patient active, works as a Naval architect for [employer] SpO2 100% at rest on RA, RR 21. With ambulation SpO2 94% on RA, RR up to 41, subjective RPE >6. Gait with mild instability, antalgic, slower pace. Educated on safety, awareness, and energy conservation techniques. Pursed lip breathing helps with symptoms, lowers RR quickly. Anticipate function and capacity will improve as medical condition resolves. Will follow acutely. Pt currently with functional limitations due to the deficits listed below (see PT Problem List). Pt will benefit from acute skilled PT to increase their independence and safety with mobility to allow discharge.           If plan is discharge home, recommend the following: Assistance with cooking/housework;Assist for transportation;Help with stairs or ramp for entrance   Can travel by private vehicle        Equipment Recommendations None recommended by PT  Recommendations for Other Services       Functional Status Assessment Patient has had a recent decline in their functional status and demonstrates the ability to make significant improvements in function in a reasonable and predictable amount of time.     Precautions / Restrictions Precautions Precautions: Fall Precaution Comments: monitor O2 Restrictions Weight Bearing Restrictions: No      Mobility  Bed Mobility Overal bed mobility: Modified Independent             General bed mobility comments: extra time    Transfers Overall transfer level: Modified independent Equipment used: None               General transfer  comment: Slow to rise but stable once upright.    Ambulation/Gait Ambulation/Gait assistance: Supervision Gait Distance (Feet): 150 Feet Assistive device: IV Pole Gait Pattern/deviations: Step-through pattern, Antalgic Gait velocity: dec Gait velocity interpretation: <1.8 ft/sec, indicate of risk for recurrent falls   General Gait Details: Minor instability slight antalgic pattern without overt LOB, pt reports close to baseline. Holding IV pole for support. Educated on awareness, safety, energy conservation and pursed lip breathing. RR up to 40s. HR 110s, SpO2 94% at nadir on RA. Supervision for safety.  Stairs            Wheelchair Mobility     Tilt Bed    Modified Rankin (Stroke Patients Only)       Balance Overall balance assessment: Mild deficits observed, not formally tested                                           Pertinent Vitals/Pain Pain Assessment Pain Assessment: Faces Faces Pain Scale: Hurts a little bit Pain Location: abdomen (muscles from coughing) Pain Descriptors / Indicators: Sore Pain Intervention(s): Monitored during session    Home Living Family/patient expects to be discharged to:: Private residence Living Arrangements: Spouse/significant other Available Help at Discharge: Family;Available 24 hours/day Type of Home: House Home Access: Stairs to enter Entrance Stairs-Rails: None Entrance Stairs-Number of Steps: 3   Home Layout: Two level;Able to live on main level with bedroom/bathroom Home Equipment: Rolling Walker (2 wheels);Cane -  single point;Wheelchair - manual      Prior Function Prior Level of Function : Independent/Modified Independent;Driving;Working/employed             Mobility Comments: ESTS truck driver ADLs Comments: ind     Extremity/Trunk Assessment   Upper Extremity Assessment Upper Extremity Assessment: Right hand dominant;Defer to OT evaluation    Lower Extremity Assessment Lower  Extremity Assessment: Generalized weakness       Communication   Communication Communication: No apparent difficulties  Cognition Arousal: Alert Behavior During Therapy: WFL for tasks assessed/performed Overall Cognitive Status: Within Functional Limits for tasks assessed                                          General Comments General comments (skin integrity, edema, etc.): SpO2 100% at rest on RA, RR 21. With ambulation SpO2 94% on RA, RR to 41.    Exercises     Assessment/Plan    PT Assessment Patient needs continued PT services  PT Problem List Decreased strength;Decreased activity tolerance;Decreased balance;Decreased mobility;Decreased knowledge of use of DME;Cardiopulmonary status limiting activity       PT Treatment Interventions DME instruction;Gait training;Stair training;Functional mobility training;Therapeutic activities;Therapeutic exercise;Balance training;Neuromuscular re-education;Patient/family education    PT Goals (Current goals can be found in the Care Plan section)  Acute Rehab PT Goals Patient Stated Goal: Get well, return to work. PT Goal Formulation: With patient Time For Goal Achievement: 05/31/23 Potential to Achieve Goals: Good    Frequency Min 1X/week     Co-evaluation               AM-PAC PT "6 Clicks" Mobility  Outcome Measure Help needed turning from your back to your side while in a flat bed without using bedrails?: None Help needed moving from lying on your back to sitting on the side of a flat bed without using bedrails?: None Help needed moving to and from a bed to a chair (including a wheelchair)?: None Help needed standing up from a chair using your arms (e.g., wheelchair or bedside chair)?: None Help needed to walk in hospital room?: A Little Help needed climbing 3-5 steps with a railing? : A Little 6 Click Score: 22    End of Session   Activity Tolerance: Patient tolerated treatment well Patient left:  in bed;with call bell/phone within reach;with family/visitor present Nurse Communication: Mobility status PT Visit Diagnosis: Other abnormalities of gait and mobility (R26.89);Muscle weakness (generalized) (M62.81);Difficulty in walking, not elsewhere classified (R26.2)    Time: 1324-4010 PT Time Calculation (min) (ACUTE ONLY): 23 min   Charges:   PT Evaluation $PT Eval Low Complexity: 1 Low PT Treatments $Gait Training: 8-22 mins PT General Charges $$ ACUTE PT VISIT: 1 Visit         Kathlyn Sacramento, PT, DPT [facility]  Rehabilitation Services Physical Therapist Office: [phone] Website: [email or web address]   Berton Mount 05/17/2023, 12:11 PM

## 2023-05-18 ENCOUNTER — Inpatient Hospital Stay (HOSPITAL_COMMUNITY): Payer: Managed Care, Other (non HMO)

## 2023-05-18 ENCOUNTER — Encounter (HOSPITAL_COMMUNITY): Payer: Self-pay | Admitting: Internal Medicine

## 2023-05-18 DIAGNOSIS — J449 Chronic obstructive pulmonary disease, unspecified: Secondary | ICD-10-CM

## 2023-05-18 DIAGNOSIS — J438 Other emphysema: Secondary | ICD-10-CM | POA: Diagnosis not present

## 2023-05-18 DIAGNOSIS — J441 Chronic obstructive pulmonary disease with (acute) exacerbation: Secondary | ICD-10-CM | POA: Diagnosis not present

## 2023-05-18 DIAGNOSIS — R0609 Other forms of dyspnea: Secondary | ICD-10-CM

## 2023-05-18 DIAGNOSIS — J189 Pneumonia, unspecified organism: Secondary | ICD-10-CM | POA: Diagnosis not present

## 2023-05-18 LAB — STREP PNEUMONIAE URINARY ANTIGEN: Strep Pneumo Urinary Antigen: POSITIVE — AB

## 2023-05-18 LAB — BASIC METABOLIC PANEL
Anion gap: 9 (ref 5–15)
BUN: 17 mg/dL (ref 8–23)
CO2: 23 mmol/L (ref 22–32)
Calcium: 8.5 mg/dL — ABNORMAL LOW (ref 8.9–10.3)
Chloride: 106 mmol/L (ref 98–111)
Creatinine, Ser: 1.05 mg/dL (ref 0.61–1.24)
GFR, Estimated: >60 mL/min (ref >60)
Glucose, Bld: 128 mg/dL — ABNORMAL HIGH (ref 70–99)
Potassium: 4.2 mmol/L (ref 3.5–5.1)
Sodium: 138 mmol/L (ref 135–145)

## 2023-05-18 LAB — ECHOCARDIOGRAM COMPLETE
Area-P 1/2: 3.1 cm2
Height: 72 in
S' Lateral: 2.3 cm
Weight: 2328.06 [oz_av]

## 2023-05-18 LAB — CBC
HCT: 38.6 % — ABNORMAL LOW (ref 39.0–52.0)
Hemoglobin: 12.8 g/dL — ABNORMAL LOW (ref 13.0–17.0)
MCH: 30.8 pg (ref 26.0–34.0)
MCHC: 33.2 g/dL (ref 30.0–36.0)
MCV: 92.8 fL (ref 80.0–100.0)
Platelets: 255 10*3/uL (ref 150–400)
RBC: 4.16 MIL/uL — ABNORMAL LOW (ref 4.22–5.81)
RDW: 13.6 % (ref 11.5–15.5)
WBC: 18.8 10*3/uL — ABNORMAL HIGH (ref 4.0–10.5)
nRBC: 0 % (ref 0.0–0.2)

## 2023-05-18 LAB — PHOSPHORUS: Phosphorus: 3.8 mg/dL (ref 2.5–4.6)

## 2023-05-18 MED ORDER — PERFLUTREN LIPID MICROSPHERE
1.0000 mL | INTRAVENOUS | Status: AC | PRN
  Administered 2023-05-18: 3 mL via INTRAVENOUS

## 2023-05-18 MED ORDER — LEVALBUTEROL HCL 0.63 MG/3ML IN NEBU
0.6300 mg | INHALATION_SOLUTION | Freq: Four times a day (QID) | RESPIRATORY_TRACT | Status: DC
  Administered 2023-05-18 – 2023-05-22 (×17): 0.63 mg via RESPIRATORY_TRACT
  Filled 2023-05-18 (×18): qty 3

## 2023-05-18 MED ORDER — LEVALBUTEROL HCL 0.63 MG/3ML IN NEBU: 0.6300 mg | INHALATION_SOLUTION | Freq: Four times a day (QID) | RESPIRATORY_TRACT | Status: DC

## 2023-05-18 MED ORDER — AZITHROMYCIN 500 MG PO TABS
500.0000 mg | ORAL_TABLET | Freq: Every day | ORAL | Status: DC
  Administered 2023-05-18: 500 mg via ORAL
  Filled 2023-05-18: qty 1

## 2023-05-18 MED ORDER — IPRATROPIUM BROMIDE 0.02 % IN SOLN
0.5000 mg | Freq: Four times a day (QID) | RESPIRATORY_TRACT | Status: DC
  Administered 2023-05-18 – 2023-05-22 (×17): 0.5 mg via RESPIRATORY_TRACT
  Filled 2023-05-18 (×18): qty 2.5

## 2023-05-18 MED ORDER — IPRATROPIUM BROMIDE 0.02 % IN SOLN: 0.5000 mg | RESPIRATORY_TRACT | Status: DC

## 2023-05-18 NOTE — Consult Note (Signed)
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 Presents to ER 12/3 w/ cc shortness of breath x 2 days. This occurred after contracting dry cough, runny nose and sore throat on long car ride from [place].  -CT chest neg for PE. Showed svr emphysema. Chronic right apex scarring and new LLL infiltrate  Respiratory viral panel was negative, COVID, flu and RSV was negative PCCM consulted  Pertinent  Medical History  COPD stage 4 Former tobacco abuse (stopped 1 yr ago)  Significant Hospital Events: Including procedures, antibiotic start and stop dates in addition to other pertinent events     Interim History / Subjective:  Feels slightly improved but shortness of breath when he tries to move Afebrile On room air  Objective   Blood pressure 108/76, pulse 92, temperature (!) 97.5 F (36.4 C), resp. rate 18, height 6' (1.829 m), weight 66 kg, SpO2 98%.        Intake/Output Summary (Last 24 hours) at 05/18/2023 1341 Last data filed at 05/18/2023 1221 Gross per 24 hour  Intake 1280.31 ml  Output 550 ml  Net 730.31 ml   Filed Weights   05/17/23 1653  Weight: 66 kg    Examination: General: Elderly, thin man, no distress, lying supine HENT: Mild pallor, no icterus, no JVD Lungs: Decreased breath sounds bilateral, no accessory muscle use Cardiovascular: S1-S2 regular, no murmur Abdomen: Soft, nontender, no hepatosplenomegaly Extremities: No edema, no deformity Neuro: Alert, interactive, nonfocal  Labs show normal electrolytes, lactate peaked at 5.0, down to 2.6, high procalcitonin, leukocytosis   Resolved Hospital Problem list     Assessment & Plan:  Severe COPD/emphysema -FEV1 24% Left lower lobe community-acquired  pneumonia  -Being treated with ceftriaxone/azithromycin, await urine strep antigen and Legionella -Continue budesonide/Brovana combination and lieu of home Breztri with Xopenex for breakthrough.  -Does not have significant bronchospasm, on Solu-Medrol 80 every 24 and this can be discontinued soon  -Primary pulmonologist had ordered esfentrine/PDE 4/5 antagonist-he has not received this yet from specialty pharmacy  Expect that he may have slow improvement over the next 3 to 4 days. PCCM will be available as needed  Best Practice (right click and "Reselect all SmartList Selections" daily)    Code Status:  full code Last date of multidisciplinary goals of care discussion [NA]  Labs   CBC: Recent Labs  Lab 05/16/23 1833 05/16/23 1843 05/16/23 2032 05/17/23 0358 05/18/23 0329  WBC 24.9*  --   --  19.6* 18.8*  NEUTROABS 21.9*  --   --   --   --   HGB 16.2 17.0 13.9 14.3 12.8*  HCT 47.2 50.0 41.0 42.3 38.6*  MCV 91.8  --   --  93.8 92.8  PLT 289  --   --  248 255    Basic Metabolic Panel: Recent Labs  Lab 05/16/23 1833 05/16/23 1843 05/16/23 2032 05/17/23 0358 05/18/23 0329  NA 134* 136 133* 135 138  K 3.8 3.8 3.9 3.7 4.2  CL 103 103 100 106 106  CO2 19*  --   --  20* 23  GLUCOSE 127* 132* 114* 157* 128*  BUN 11 12 12 11 17   CREATININE 0.99 0.90 1.00 1.02 1.05  CALCIUM 8.9  --   --  8.3* 8.5*  MG  --   --   --  2.4  --   PHOS  --   --   --  1.0* 3.8   GFR: Estimated Creatinine Clearance: 57.6 mL/min (by C-G formula based on SCr of 1.05 mg/dL). Recent Labs  Lab 05/16/23 1833 05/16/23 1839 05/16/23 2028 05/16/23 2335 05/17/23 0358 05/17/23 0659 05/17/23 1513 05/18/23 0329  PROCALCITON  --   --   --   --   --  4.61  --   --   WBC 24.9*  --   --   --  19.6*  --   --  18.8*  LATICACIDVEN  --  2.3* 4.3* 5.0*  --   --  2.6*  --     Liver Function Tests: Recent Labs  Lab 05/16/23 1833  AST 26  ALT 18  ALKPHOS 68  BILITOT 2.7*  PROT 6.6  ALBUMIN 3.6    No results for input(s): "LIPASE", "AMYLASE" in the last 168 hours. No results for input(s): "AMMONIA" in the last 168 hours.  ABG    Component Value Date/Time   TCO2 21 (L) 05/16/2023 2032     Coagulation Profile: Recent Labs  Lab 05/16/23 1833  INR 1.1    Cardiac Enzymes: No results for input(s): "CKTOTAL", "CKMB", "CKMBINDEX", "TROPONINI" in the last 168 hours.  HbA1C: No results found for: "HGBA1C"  CBG: No results for input(s): "GLUCAP" in the last 168 hours.  Review of Systems:   Constitutional: negative for anorexia, fevers and sweats  Eyes: negative for irritation, redness and visual disturbance  Ears, nose, mouth, throat, and face: negative for earaches, epistaxis, nasal congestion and sore throat  Cardiovascular: negative for chest pain, dyspnea, lower extremity edema, orthopnea, palpitations and syncope  Gastrointestinal: negative for abdominal pain, constipation, diarrhea, melena, nausea and vomiting  Genitourinary:negative for dysuria, frequency and hematuria  Hematologic/lymphatic: negative for bleeding, easy bruising and lymphadenopathy  Musculoskeletal:negative for arthralgias, muscle weakness and stiff joints  Neurological: negative for coordination problems, gait problems, headaches and weakness  Endocrine: negative for diabetic symptoms including polydipsia, polyuria and weight loss   Past Medical History:  He,  has a past medical history of Emphysema lung (HCC).   Surgical History:   Past Surgical History:  Procedure Laterality Date   CHOLECYSTECTOMY  2010     Social History:   reports that he quit smoking about 2 years ago. His smoking use included cigarettes. He started smoking about 54 years ago. He has a 78 pack-year smoking history. He has been exposed to tobacco smoke. He has never used smokeless tobacco. He reports that he does not drink alcohol and does not use drugs.   Family History:  His family history includes Diabetes in his  father and another family member. There is no history of Cancer, Heart disease, or Hypertension.   Allergies No Known Allergies   Home Medications  Prior to Admission medications   Medication Sig Start Date End Date Taking? Authorizing Provider  albuterol (PROVENTIL) (2.5 MG/3ML) 0.083% nebulizer solution TAKE 3 MLS BY NEBULIZATION EVERY 6 HOURSAS NEEDED FOR WHEEZING OR SHORTNESS OF BREATH 07/25/22  Yes Salena Saner, MD  albuterol (VENTOLIN HFA) 108 (90 Base) MCG/ACT inhaler INHALE 2 PUFFS INTO THE LUNGS 3 TIMES DAILY AS NEEDED 07/15/22  Yes Karie Schwalbe, MD  BREZTRI AEROSPHERE 160-9-4.8 MCG/ACT AERO INHALE TWO PUFFS INTO THE LUNGS IN THE MORNING AND AT BEDTIME 11/10/22  Yes Salena Saner, MD  diphenhydramine-acetaminophen (TYLENOL PM) 25-500  MG TABS tablet Take 1 tablet by mouth at bedtime as needed.   Yes [provider]  Ensifentrine (OHTUVAYRE) 3 MG/2.5ML SUSP Take 3 mg by nebulization 2 (two) times daily. 05/01/23  Yes Salena Saner, MD  ipratropium-albuterol (DUONEB) 0.5-2.5 (3) MG/3ML SOLN Take 3 mLs by nebulization 4 (four) times daily. 02/28/22  Yes Salena Saner, MD  triamcinolone cream (KENALOG) 0.1 % APPLY TOPICALLY TWICE DAILY AS NEEDED. 01/03/22  Yes Karie Schwalbe, MD      Cyril Mourning MD. FCCP. [facility] Pager : [phone]  If no response to pager , please call [phone] until 7 pm After 7:00 pm call Elink  [phone]   05/18/2023

## 2023-05-18 NOTE — Progress Notes (Signed)
[facility]                                                                              Eugene Patel, is a 72 y.o. male, DOB - 11-Jan-1951, VOZ:[id number] Admit date - 05/16/2023    Outpatient Primary MD for the patient is Karie Schwalbe, MD  LOS - 2  days  Chief Complaint  Patient presents with   Shortness of Breath            Brief summary   Patient is a 72 year old male with COPD, former tobacco use, quit 1 year ago presented to ED with sudden onset of shortness of breath around 3 AM on the day of admission, no chest pain.  In ED, was noted to be tripoding, chest x-ray showed hyperinflation with emphysema, groundglass opacities in the left peripheral lung base possible pneumonia.  Patient was placed on empiric IV antibiotics. In ED, temp 97.7 F, BP 90/58, pulse 96, O2 sats 90% on room air, RR 26   Assessment & Plan    Principal Problem:   Sepsis (HCC), community-acquired pneumonia, dyspnea -Patient met Sepsis criteria due to borderline BP, tachycardia, leukocytosis, lactic acidosis, elevated procalcitonin, source likely due to community-acquired pneumonia -CTA negative for PE. -COVID-negative, flu, RSV negative respiratory virus panel negative -Blood cultures negative so far, urine Legionella antigen, urine strep antigen pending -Continue IV Zithromax, Rocephin -BNP 150.4, followed by echo, no prior cardiac workup -Lactic acid 5.0 on admission, improved to 2.6 -Patient feels dyspneic on ambulation, will request pulmonology evaluation, (outpatient follows Dr. Jayme Cloud)  Active Problems:   COPD (chronic obstructive pulmonary disease) with emphysema (HCC) -Noted to have wheezing, continue Xopenex, Atrovent nebs every 4 hours, Pulmicort, Brovana -Continue IV Solu-Medrol 80 mg daily -Home O2 evaluation prior to discharge -Quit smoking 1 year ago  Hypophosphatemia -Continue Neutra-Phos twice daily   Mild hypovolemic hyponatremia -Received IV fluid  hydration - Na stable   Estimated body mass index is 19.73 kg/m as calculated from the following:   Height as of this encounter: 6' (1.829 m).   Weight as of this encounter: 66 kg.  Code Status: Full code DVT Prophylaxis:  enoxaparin (LOVENOX) injection 40 mg Start: 05/17/23 1000   Level of Care: Level of care: Telemetry Medical Family Communication: Updated patient's wife at the bedside Disposition Plan:      Remains inpatient appropriate:     Procedures:  None  Consultants:   None  Antimicrobials:   Anti-infectives (From admission, onward)    Start     Dose/Rate Route Frequency Ordered Stop   05/18/23 1800  azithromycin (ZITHROMAX) tablet 500 mg        500 mg Oral Daily-1800 05/18/23 0950 05/21/23 1759   05/17/23 1830  azithromycin (ZITHROMAX) 500 mg in sodium chloride 0.9 % 250 mL IVPB  Status:  Discontinued        500 mg 250 mL/hr over 60 Minutes Intravenous Every 24 hours 05/17/23 0036 05/18/23 0950   05/17/23 1800  cefTRIAXone (ROCEPHIN) 2 g in sodium chloride 0.9 % 100 mL IVPB        2 g 200 mL/hr over 30 Minutes  Intravenous Every 24 hours 05/17/23 0036 05/20/23 2359   05/16/23 2130  cefTRIAXone (ROCEPHIN) 2 g in sodium chloride 0.9 % 100 mL IVPB        2 g 200 mL/hr over 30 Minutes Intravenous  Once 05/16/23 2117 05/16/23 2201   05/16/23 2130  azithromycin (ZITHROMAX) 500 mg in sodium chloride 0.9 % 250 mL IVPB        500 mg 250 mL/hr over 60 Minutes Intravenous  Once 05/16/23 2117 05/16/23 2349          Medications  arformoterol  15 mcg Nebulization BID   azithromycin  500 mg Oral q1800   budesonide (PULMICORT) nebulizer solution  0.25 mg Nebulization BID   enoxaparin (LOVENOX) injection  40 mg Subcutaneous Q24H   levalbuterol  0.63 mg Nebulization QID   And   ipratropium  0.5 mg Nebulization QID   methylPREDNISolone (SOLU-MEDROL) injection  80 mg Intravenous Daily   phosphorus  500 mg Oral BID      Subjective:   Eugene Patel was seen and  examined today.  Feels short of breath on ambulation, difficulty catching breath.  No chest pain, abdominal pain, nausea or vomiting.    Objective:   Vitals:   05/17/23 2348 05/17/23 2356 05/18/23 0524 05/18/23 0959  BP:  105/76 (!) 88/55 108/76  Pulse:  99 94 92  Resp:  20 20 18   Temp:  97.8 F (36.6 C) 97.8 F (36.6 C) (!) 97.5 F (36.4 C)  TempSrc:      SpO2: 97% 98% 95% 99%  Weight:      Height:        Intake/Output Summary (Last 24 hours) at 05/18/2023 1107 Last data filed at 05/18/2023 0526 Gross per 24 hour  Intake 940.31 ml  Output 550 ml  Net 390.31 ml     Wt Readings from Last 3 Encounters:  05/17/23 66 kg  05/01/23 67 kg  08/22/22 66.2 kg    Physical Exam General: Alert and oriented x 3, NAD Cardiovascular: S1 S2 clear, RRR.  Respiratory: Diminished breath sounds throughout with faint wheezing Gastrointestinal: Soft, NT ND, NBS Ext: no pedal edema bilaterally Neuro: no new deficits Psych: Normal affect    Data Reviewed:  I have personally reviewed following labs    CBC Lab Results  Component Value Date   WBC 18.8 (H) 05/18/2023   RBC 4.16 (L) 05/18/2023   HGB 12.8 (L) 05/18/2023   HCT 38.6 (L) 05/18/2023   MCV 92.8 05/18/2023   MCH 30.8 05/18/2023   PLT 255 05/18/2023   MCHC 33.2 05/18/2023   RDW 13.6 05/18/2023   LYMPHSABS 0.9 05/16/2023   MONOABS 1.8 (H) 05/16/2023   EOSABS 0.0 05/16/2023   BASOSABS 0.1 05/16/2023     Last metabolic panel Lab Results  Component Value Date   NA 138 05/18/2023   K 4.2 05/18/2023   CL 106 05/18/2023   CO2 23 05/18/2023   BUN 17 05/18/2023   CREATININE 1.05 05/18/2023   GLUCOSE 128 (H) 05/18/2023   GFRNONAA >60 05/18/2023   GFRAA  02/21/2009    >60        The eGFR has been calculated using the MDRD equation. This calculation has not been validated in all clinical situations. eGFR's persistently <60 mL/min signify possible Chronic Kidney Disease.   CALCIUM 8.5 (L) 05/18/2023   PHOS 3.8  05/18/2023   PROT 6.6 05/16/2023   ALBUMIN 3.6 05/16/2023   BILITOT 2.7 (H) 05/16/2023   ALKPHOS 68 05/16/2023  AST 26 05/16/2023   ALT 18 05/16/2023   ANIONGAP 9 05/18/2023    CBG (last 3)  No results for input(s): "GLUCAP" in the last 72 hours.    Coagulation Profile: Recent Labs  Lab 05/16/23 1833  INR 1.1     Radiology Studies: I have personally reviewed the imaging studies  CT Angio Chest PE W/Cm &/Or Wo Cm  Result Date: 05/16/2023 CLINICAL DATA:  Pulmonary embolus suspected with high probability. EXAM: CT ANGIOGRAPHY CHEST WITH CONTRAST TECHNIQUE: Multidetector CT imaging of the chest was performed using the standard protocol during bolus administration of intravenous contrast. Multiplanar CT image reconstructions and MIPs were obtained to evaluate the vascular anatomy. RADIATION DOSE REDUCTION: This exam was performed according to the departmental dose-optimization program which includes automated exposure control, adjustment of the mA and/or kV according to patient size and/or use of iterative reconstruction technique. CONTRAST:  75mL OMNIPAQUE IOHEXOL 350 MG/ML SOLN COMPARISON:  Chest radiograph 05/16/2023.  CT chest 05/23/2022 FINDINGS: Cardiovascular: Technically adequate study with good opacification of the central and segmental pulmonary arteries and mild motion artifact. No focal filling defects are seen. No evidence of significant pulmonary embolus. Normal heart size. No pericardial effusions. Normal caliber thoracic aorta. Minimal calcification in the aorta and coronary arteries. Mediastinum/Nodes: No enlarged mediastinal, hilar, or axillary lymph nodes. Thyroid gland, trachea, and esophagus demonstrate no significant findings. Lungs/Pleura: Severe emphysematous changes in the lungs. Nodular scarring in the right apex is unchanged. New interstitial infiltrates with ground-glass in the left lung base could represent superimposed pneumonia or possibly developing interstitial  lung disease. Calcified granulomas. No pleural effusions. No pneumothorax. Upper Abdomen: Multiple cysts in the liver without change. No imaging follow-up is indicated. Surgical absence of the gallbladder. No acute abnormalities are suggested. Musculoskeletal: Degenerative changes in the spine. No acute bony abnormalities. Review of the MIP images confirms the above findings. IMPRESSION: 1. No evidence of significant pulmonary embolus. 2. Severe emphysematous changes throughout the lungs. Chronic scarring in the right apex. 3. New interstitial and alveolar infiltrates in the left lung base probably representing pneumonia. Less likely developing interstitial lung disease with acute alveolitis. Electronically Signed   By: Burman Nieves M.D.   On: 05/16/2023 21:35   DG Chest 2 View  Result Date: 05/16/2023 CLINICAL DATA:  Possible sepsis cough and chest pain EXAM: CHEST - 2 VIEW COMPARISON:  07/18/2022 FINDINGS: Hyperinflation with emphysema ground-glass opacity at the left peripheral lung base. No pleural effusion. Normal cardiomediastinal silhouette. No pneumothorax IMPRESSION: Hyperinflation with emphysema. Ground-glass opacity at the left peripheral lung base, possible pneumonia. Electronically Signed   By: Jasmine Pang M.D.   On: 05/16/2023 20:21       Keidan Aumiller M.D. [facility] 05/18/2023, 11:07 AM  Available via Epic secure chat 7am-7pm After 7 pm, please refer to night coverage provider listed on amion.

## 2023-05-18 NOTE — Progress Notes (Signed)
Physical Therapy Treatment Patient Details Name: Eugene Patel MRN: [id number] DOB: 06-08-1951 Today's Date: 05/18/2023   History of Present Illness 72 y.o. male presents to the ED 12/3 with sudden onset shortness of breath, dx community-acquired pneumonia. PMHx: COPD, former tobacco user, quit 1 year ago.    PT Comments  The pt was agreeable to session with focus on progressing endurance and mobility. He demos good stability, especially with increased ambulation but continues to fatigue quickly. HR to max 123bpm with ambulation and SpO2 to low of 90% on RA, but pt with increased dyspnea and RR to 36. Pt recovered well after short seated rest, then was able to complete additional 150 ft walk with similar vitals. Discussed importance of continued progress for endurance training. Pt understanding of all education. Reviewed IS use, pt pulling .    If plan is discharge home, recommend the following: Assistance with cooking/housework;Assist for transportation;Help with stairs or ramp for entrance   Can travel by private vehicle        Equipment Recommendations  None recommended by PT    Recommendations for Other Services       Precautions / Restrictions Precautions Precautions: Fall Precaution Comments: monitor O2 Restrictions Weight Bearing Restrictions: No     Mobility  Bed Mobility Overal bed mobility: Modified Independent             General bed mobility comments: extra time    Transfers Overall transfer level: Modified independent Equipment used: None               General transfer comment: Slow to rise but stable once upright.    Ambulation/Gait Ambulation/Gait assistance: Supervision Gait Distance (Feet): 150 Feet (+ 150) Assistive device: None Gait Pattern/deviations: Step-through pattern, Antalgic Gait velocity: decreased Gait velocity interpretation: <1.31 ft/sec, indicative of household ambulator   General Gait Details: Minor instability  slight antalgic pattern without overt LOB, pt reports close to baseline. SpO2 to low of 90% on RA, HR to 116bpm. RR 36, recovered after 3 min seated rest     Balance Overall balance assessment: Mild deficits observed, not formally tested                                          Cognition Arousal: Alert Behavior During Therapy: WFL for tasks assessed/performed Overall Cognitive Status: Within Functional Limits for tasks assessed                                             General Comments General comments (skin integrity, edema, etc.): HR to 116, SpO2 to 90% on RA RR 30 while walking, then recovered to HR 100 SpO2 98% on RA at rest      Pertinent Vitals/Pain Pain Assessment Pain Assessment: No/denies pain Pain Intervention(s): Monitored during session     PT Goals (current goals can now be found in the care plan section) Acute Rehab PT Goals Patient Stated Goal: Get well, return to work. PT Goal Formulation: With patient Time For Goal Achievement: 05/31/23 Potential to Achieve Goals: Good Progress towards PT goals: Progressing toward goals    Frequency    Min 1X/week       AM-PAC PT "6 Clicks" Mobility   Outcome Measure  Help needed turning from your back to  your side while in a flat bed without using bedrails?: None Help needed moving from lying on your back to sitting on the side of a flat bed without using bedrails?: None Help needed moving to and from a bed to a chair (including a wheelchair)?: None Help needed standing up from a chair using your arms (e.g., wheelchair or bedside chair)?: None Help needed to walk in hospital room?: A Little Help needed climbing 3-5 steps with a railing? : A Little 6 Click Score: 22    End of Session   Activity Tolerance: Patient tolerated treatment well Patient left: in bed;with call bell/phone within reach;with family/visitor present Nurse Communication: Mobility status PT Visit  Diagnosis: Other abnormalities of gait and mobility (R26.89);Muscle weakness (generalized) (M62.81);Difficulty in walking, not elsewhere classified (R26.2)     Time: 4098-1191 PT Time Calculation (min) (ACUTE ONLY): 27 min  Charges:    $Therapeutic Exercise: 23-37 mins PT General Charges $$ ACUTE PT VISIT: 1 Visit                     Vickki Muff, PT, DPT   Acute Rehabilitation Department Office [phone] Secure Chat Communication Preferred   Ronnie Derby 05/18/2023, 2:34 PM

## 2023-05-18 NOTE — TOC CM/SW Note (Addendum)
Transition of Care [facility]) - Inpatient Brief Assessment   Patient Details  Name: CASSEY BATESON MRN: [id number] Date of Birth: 1951/02/12  [facility]) CM/SW Contact:    Tom-Johnson, Hershal Coria, RN Phone Number: 05/18/2023, 12:23 PM   Clinical Narrative:  Patient presented to the ED with Shortness Of Breath. Found to have Sepsis 2/2 Pneumonia On IV abx, IV Steroid and Neb tx. On Room Air.   From home with wife, has two supportive children. Currently employed as a Naval architect. Independent with care. Does not have DME's at home.  PCP is Karie Schwalbe, MD and uses [facility].   No PT/OT f/u, no TOC needs or recommendations noted at this time.  Patient not Medically ready for discharge.  CM will continue to follow as patient progresses with care towards discharge.                Transition of Care Asessment: Insurance and Status: Insurance coverage has been reviewed Patient has primary care physician: Yes Home environment has been reviewed: Yes Prior level of function:: Independent Prior/Current Home Services: No current home services Social Determinants of Health Reivew: SDOH reviewed no interventions necessary Readmission risk has been reviewed: Yes Transition of care needs: no transition of care needs at this time

## 2023-05-18 NOTE — Progress Notes (Signed)
Mobility Specialist Progress Note:   05/18/23 0925  Mobility  Activity Ambulated with assistance in hallway  Level of Assistance Standby assist, set-up cues, supervision of patient - no hands on  Assistive Device None  Distance Ambulated (ft) 100 ft  Activity Response Tolerated well  Mobility Referral Yes  Mobility visit 1 Mobility  Mobility Specialist Start Time (ACUTE ONLY) 0925  Mobility Specialist Stop Time (ACUTE ONLY) 0935  Mobility Specialist Time Calculation (min) (ACUTE ONLY) 10 min   Pt agreeable to mobility session. Required only supervision for safety. Distance limited by quick fatigue, and SOB. VSS On RA. Pt back in bed with all needs met.   Addison Lank Mobility Specialist Please contact via SecureChat or  Rehab office at [phone]

## 2023-05-18 NOTE — Progress Notes (Signed)
Echocardiogram 2D Echocardiogram has been performed.  Warren Lacy Darianna Amy RDCS 05/18/2023, 8:25 AM

## 2023-05-19 DIAGNOSIS — J13 Pneumonia due to Streptococcus pneumoniae: Secondary | ICD-10-CM | POA: Diagnosis present

## 2023-05-19 DIAGNOSIS — J189 Pneumonia, unspecified organism: Secondary | ICD-10-CM | POA: Diagnosis not present

## 2023-05-19 DIAGNOSIS — E871 Hypo-osmolality and hyponatremia: Secondary | ICD-10-CM | POA: Diagnosis present

## 2023-05-19 DIAGNOSIS — J441 Chronic obstructive pulmonary disease with (acute) exacerbation: Secondary | ICD-10-CM | POA: Diagnosis not present

## 2023-05-19 LAB — CBC
HCT: 37.2 % — ABNORMAL LOW (ref 39.0–52.0)
Hemoglobin: 12.3 g/dL — ABNORMAL LOW (ref 13.0–17.0)
MCH: 31.5 pg (ref 26.0–34.0)
MCHC: 33.1 g/dL (ref 30.0–36.0)
MCV: 95.1 fL (ref 80.0–100.0)
Platelets: 247 10*3/uL (ref 150–400)
RBC: 3.91 MIL/uL — ABNORMAL LOW (ref 4.22–5.81)
RDW: 13.8 % (ref 11.5–15.5)
WBC: 13.2 10*3/uL — ABNORMAL HIGH (ref 4.0–10.5)
nRBC: 0 % (ref 0.0–0.2)

## 2023-05-19 LAB — BASIC METABOLIC PANEL
Anion gap: 7 (ref 5–15)
BUN: 15 mg/dL (ref 8–23)
CO2: 25 mmol/L (ref 22–32)
Calcium: 8.2 mg/dL — ABNORMAL LOW (ref 8.9–10.3)
Chloride: 107 mmol/L (ref 98–111)
Creatinine, Ser: 1.14 mg/dL (ref 0.61–1.24)
GFR, Estimated: >60 mL/min (ref >60)
Glucose, Bld: 98 mg/dL (ref 70–99)
Potassium: 4.1 mmol/L (ref 3.5–5.1)
Sodium: 139 mmol/L (ref 135–145)

## 2023-05-19 LAB — PROCALCITONIN: Procalcitonin: 1.57 ng/mL

## 2023-05-19 LAB — PHOSPHORUS: Phosphorus: 3.7 mg/dL (ref 2.5–4.6)

## 2023-05-19 NOTE — Progress Notes (Signed)
[facility]                                                                              Eugene Patel, is a 72 y.o. male, DOB - 1950/10/21, QMV:[id number] Admit date - 05/16/2023    Outpatient Primary MD for the patient is Karie Schwalbe, MD  LOS - 3  days  Chief Complaint  Patient presents with   Shortness of Breath            Brief summary   Patient is a 72 year old male with COPD, former tobacco use, quit 1 year ago presented to ED with sudden onset of shortness of breath around 3 AM on the day of admission, no chest pain.  In ED, was noted to be tripoding, chest x-ray showed hyperinflation with emphysema, groundglass opacities in the left peripheral lung base possible pneumonia.  Patient was placed on empiric IV antibiotics. In ED, temp 97.7 F, BP 90/58, pulse 96, O2 sats 90% on room air, RR 26   Assessment & Plan    Principal Problem:   Sepsis (HCC), CAP PNA, dyspnea pneumococcal pneumonia -Patient met Sepsis criteria due to borderline BP, tachycardia, leukocytosis, lactic acidosis, elevated procalcitonin, source likely due to community-acquired pneumonia -CTA negative for PE. -COVID-negative, flu, RSV negative respiratory virus panel negative -Blood cultures negative so far, urine strep antigen positive  -Urine Legionella antigen pending  -DC Zithromax, continue IV Rocephin 2 g IV daily -Lactic acid 5.0 on admission, improved to 2.6 -Patient feels dyspneic on ambulation, appreciate pulmonology recommendations, (outpatient follows Dr. Jayme Cloud) -Leukocytosis improving, procalcitonin improving -2D echo showed EF of 60 to 65%, no regional WMA  Active Problems:   COPD (chronic obstructive pulmonary disease) with emphysema (HCC) -Noted to have wheezing, continue Xopenex, Atrovent nebs every 4 hours, Pulmicort, Brovana -Continue IV Solu-Medrol 80 mg daily, will transition to oral prednisone with taper upon discharge -Home O2 evaluation prior to  discharge -Quit smoking 1 year ago  Hypophosphatemia -Continue Neutra-Phos twice daily   Mild hypovolemic hyponatremia -Received IV fluid hydration -Improved   Estimated body mass index is 19.73 kg/m as calculated from the following:   Height as of this encounter: 6' (1.829 m).   Weight as of this encounter: 66 kg.  Code Status: Full code DVT Prophylaxis:  enoxaparin (LOVENOX) injection 40 mg Start: 05/17/23 1000   Level of Care: Level of care: Telemetry Medical Family Communication: Updated patient's wife at the bedside Disposition Plan:      Remains inpatient appropriate: Likely DC home in a.m.   Procedures:  None  Consultants:   Pulm   Antimicrobials:   Anti-infectives (From admission, onward)    Start     Dose/Rate Route Frequency Ordered Stop   05/18/23 1800  azithromycin (ZITHROMAX) tablet 500 mg  Status:  Discontinued        500 mg Oral Daily-1800 05/18/23 0950 05/19/23 0801   05/17/23 1830  azithromycin (ZITHROMAX) 500 mg in sodium chloride 0.9 % 250 mL IVPB  Status:  Discontinued        500 mg 250 mL/hr over 60 Minutes Intravenous Every 24 hours 05/17/23 0036 05/18/23 0950  05/17/23 1800  cefTRIAXone (ROCEPHIN) 2 g in sodium chloride 0.9 % 100 mL IVPB        2 g 200 mL/hr over 30 Minutes Intravenous Every 24 hours 05/17/23 0036 05/23/23 1759   05/16/23 2130  cefTRIAXone (ROCEPHIN) 2 g in sodium chloride 0.9 % 100 mL IVPB        2 g 200 mL/hr over 30 Minutes Intravenous  Once 05/16/23 2117 05/16/23 2201   05/16/23 2130  azithromycin (ZITHROMAX) 500 mg in sodium chloride 0.9 % 250 mL IVPB        500 mg 250 mL/hr over 60 Minutes Intravenous  Once 05/16/23 2117 05/16/23 2349          Medications  arformoterol  15 mcg Nebulization BID   budesonide (PULMICORT) nebulizer solution  0.25 mg Nebulization BID   enoxaparin (LOVENOX) injection  40 mg Subcutaneous Q24H   levalbuterol  0.63 mg Nebulization QID   And   ipratropium  0.5 mg Nebulization QID    methylPREDNISolone (SOLU-MEDROL) injection  80 mg Intravenous Daily   phosphorus  500 mg Oral BID      Subjective:   Eugene Patel was seen and examined today.  Still feels somewhat short of breath on ambulation, overall improving, still has cough.  No fevers, chills, abdominal pain, nausea or vomiting.    Objective:   Vitals:   05/18/23 1950 05/18/23 2026 05/19/23 0459 05/19/23 0800  BP:  131/86 (!) 90/46 126/75  Pulse: 70 95 75 88  Resp: 20   18  Temp:  98.2 F (36.8 C) 97.6 F (36.4 C) 98.1 F (36.7 C)  TempSrc:  Oral Oral Oral  SpO2: 95% 96% 97% 100%  Weight:      Height:        Intake/Output Summary (Last 24 hours) at 05/19/2023 1401 Last data filed at 05/19/2023 0740 Gross per 24 hour  Intake 480 ml  Output --  Net 480 ml     Wt Readings from Last 3 Encounters:  05/17/23 66 kg  05/01/23 67 kg  08/22/22 66.2 kg   Physical Exam General: Alert and oriented x 3, NAD Cardiovascular: S1 S2 clear, RRR.  Respiratory: Diminished breath sound at the bases Gastrointestinal: Soft, nontender, nondistended, NBS Ext: no pedal edema bilaterally Neuro: no new deficits Psych: Normal affect    Data Reviewed:  I have personally reviewed following labs    CBC Lab Results  Component Value Date   WBC 13.2 (H) 05/19/2023   RBC 3.91 (L) 05/19/2023   HGB 12.3 (L) 05/19/2023   HCT 37.2 (L) 05/19/2023   MCV 95.1 05/19/2023   MCH 31.5 05/19/2023   PLT 247 05/19/2023   MCHC 33.1 05/19/2023   RDW 13.8 05/19/2023   LYMPHSABS 0.9 05/16/2023   MONOABS 1.8 (H) 05/16/2023   EOSABS 0.0 05/16/2023   BASOSABS 0.1 05/16/2023     Last metabolic panel Lab Results  Component Value Date   NA 139 05/19/2023   K 4.1 05/19/2023   CL 107 05/19/2023   CO2 25 05/19/2023   BUN 15 05/19/2023   CREATININE 1.14 05/19/2023   GLUCOSE 98 05/19/2023   GFRNONAA >60 05/19/2023   GFRAA  02/21/2009    >60        The eGFR has been calculated using the MDRD equation. This calculation has  not been validated in all clinical situations. eGFR's persistently <60 mL/min signify possible Chronic Kidney Disease.   CALCIUM 8.2 (L) 05/19/2023   PHOS 3.7 05/19/2023  PROT 6.6 05/16/2023   ALBUMIN 3.6 05/16/2023   BILITOT 2.7 (H) 05/16/2023   ALKPHOS 68 05/16/2023   AST 26 05/16/2023   ALT 18 05/16/2023   ANIONGAP 7 05/19/2023    CBG (last 3)  No results for input(s): "GLUCAP" in the last 72 hours.    Coagulation Profile: Recent Labs  Lab 05/16/23 1833  INR 1.1     Radiology Studies: I have personally reviewed the imaging studies  ECHOCARDIOGRAM COMPLETE  Result Date: 05/18/2023    ECHOCARDIOGRAM REPORT   Patient Name:   LATONIO FILOSA Date of Exam: 05/18/2023 Medical Rec #:  [id number]       Height:       72.0 in Accession #:    [id number]      Weight:       145.5 lb Date of Birth:  [birth year]      BSA:          1.861 m Patient Age:    72 years        BP:           88/55 mmHg Patient Gender: M               HR:           91 bpm. Exam Location:  Inpatient Procedure: 2D Echo, Color Doppler, Cardiac Doppler and Intracardiac            Opacification Agent Indications:    R06.9 DOE  History:        Patient has no prior history of Echocardiogram examinations.                 COPD.  Sonographer:    Irving Burton Senior RDCS Referring Phys: [id number] Josalin Carneiro K Laura Caldas  Sonographer Comments: Technically difficult due to thin body habitus and COPD IMPRESSIONS  1. Left ventricular ejection fraction, by estimation, is 60 to 65%. The left ventricle has normal function. The left ventricle has no regional wall motion abnormalities. Left ventricular diastolic parameters were normal.  2. Right ventricular systolic function is normal. The right ventricular size is normal. There is normal pulmonary artery systolic pressure. The estimated right ventricular systolic pressure is 26.0 mmHg.  3. The mitral valve was not well visualized. Trivial mitral valve regurgitation. No evidence of mitral stenosis.  4. The aortic  valve is normal in structure. Aortic valve regurgitation is not visualized. No aortic stenosis is present.  5. The inferior vena cava is normal in size with greater than 50% respiratory variability, suggesting right atrial pressure of 3 mmHg. FINDINGS  Left Ventricle: Left ventricular ejection fraction, by estimation, is 60 to 65%. The left ventricle has normal function. The left ventricle has no regional wall motion abnormalities. Definity contrast agent was given IV to delineate the left ventricular  endocardial borders. The left ventricular internal cavity size was normal in size. There is no left ventricular hypertrophy. Left ventricular diastolic parameters were normal. Right Ventricle: The right ventricular size is normal. No increase in right ventricular wall thickness. Right ventricular systolic function is normal. There is normal pulmonary artery systolic pressure. The tricuspid regurgitant velocity is 2.40 m/s, and  with an assumed right atrial pressure of 3 mmHg, the estimated right ventricular systolic pressure is 26.0 mmHg. Left Atrium: Left atrial size was normal in size. Right Atrium: Right atrial size was normal in size. Pericardium: There is no evidence of pericardial effusion. Mitral Valve: The mitral valve was not well visualized. Trivial mitral valve regurgitation. No  evidence of mitral valve stenosis. Tricuspid Valve: The tricuspid valve is normal in structure. Tricuspid valve regurgitation is trivial. No evidence of tricuspid stenosis. Aortic Valve: The aortic valve is normal in structure. Aortic valve regurgitation is not visualized. No aortic stenosis is present. Pulmonic Valve: The pulmonic valve was normal in structure. Pulmonic valve regurgitation is not visualized. No evidence of pulmonic stenosis. Aorta: The aortic root is normal in size and structure. Venous: The inferior vena cava is normal in size with greater than 50% respiratory variability, suggesting right atrial pressure of 3  mmHg. IAS/Shunts: No atrial level shunt detected by color flow Doppler.  LEFT VENTRICLE PLAX 2D LVIDd:         3.30 cm   Diastology LVIDs:         2.30 cm   LV e' medial:    8.70 cm/s LV PW:         0.80 cm   LV E/e' medial:  7.7 LV IVS:        0.80 cm   LV e' lateral:   10.30 cm/s LVOT diam:     2.00 cm   LV E/e' lateral: 6.5 LV SV:         52 LV SV Index:   28 LVOT Area:     3.14 cm  RIGHT VENTRICLE RV S prime:     12.20 cm/s TAPSE (M-mode): 1.8 cm LEFT ATRIUM           Index        RIGHT ATRIUM           Index LA diam:      2.90 cm 1.56 cm/m   RA Area:     11.10 cm LA Vol (A4C): 31.0 ml 16.66 ml/m  RA Volume:   24.00 ml  12.90 ml/m  AORTIC VALVE LVOT Vmax:   75.00 cm/s LVOT Vmean:  54.800 cm/s LVOT VTI:    0.164 m  AORTA Ao Root diam: 2.90 cm MITRAL VALVE               TRICUSPID VALVE MV Area (PHT): 3.10 cm    TR Peak grad:   23.0 mmHg MV Decel Time: 245 msec    TR Vmax:        240.00 cm/s MV E velocity: 66.60 cm/s MV A velocity: 77.60 cm/s  SHUNTS MV E/A ratio:  0.86        Systemic VTI:  0.16 m                            Systemic Diam: 2.00 cm Arvilla Meres MD Electronically signed by Arvilla Meres MD Signature Date/Time: 05/18/2023/11:28:13 AM    Final        Thad Ranger M.D. [facility] 05/19/2023, 2:01 PM  Available via Epic secure chat 7am-7pm After 7 pm, please refer to night coverage provider listed on amion.

## 2023-05-19 NOTE — Progress Notes (Signed)
Mobility Specialist: Progress Note   05/19/23 1200  Mobility  Activity Ambulated with assistance in hallway  Level of Assistance Standby assist, set-up cues, supervision of patient - no hands on  Assistive Device None  Distance Ambulated (ft) 300 ft  Activity Response Tolerated well  Mobility Referral Yes  Mobility visit 1 Mobility  Mobility Specialist Start Time (ACUTE ONLY) 0911  Mobility Specialist Stop Time (ACUTE ONLY) 0919  Mobility Specialist Time Calculation (min) (ACUTE ONLY) 8 min    Pt was agreeable to mobility session - received in bed. No complaints. Took 2x standing breaks d/t SOB. SpO2 WFL on RA throughout. Returned to room without fault. Left in bed with all needs met, call bell in reach.   Maurene Capes Mobility Specialist Please contact via SecureChat or Rehab office at [phone]

## 2023-05-19 NOTE — Plan of Care (Signed)

## 2023-05-20 DIAGNOSIS — J438 Other emphysema: Secondary | ICD-10-CM | POA: Diagnosis not present

## 2023-05-20 DIAGNOSIS — J189 Pneumonia, unspecified organism: Secondary | ICD-10-CM | POA: Diagnosis not present

## 2023-05-20 DIAGNOSIS — J441 Chronic obstructive pulmonary disease with (acute) exacerbation: Secondary | ICD-10-CM | POA: Diagnosis not present

## 2023-05-20 LAB — CBC
HCT: 38.8 % — ABNORMAL LOW (ref 39.0–52.0)
Hemoglobin: 13 g/dL (ref 13.0–17.0)
MCH: 31.8 pg (ref 26.0–34.0)
MCHC: 33.5 g/dL (ref 30.0–36.0)
MCV: 94.9 fL (ref 80.0–100.0)
Platelets: 271 10*3/uL (ref 150–400)
RBC: 4.09 MIL/uL — ABNORMAL LOW (ref 4.22–5.81)
RDW: 13.5 % (ref 11.5–15.5)
WBC: 12.1 10*3/uL — ABNORMAL HIGH (ref 4.0–10.5)
nRBC: 0 % (ref 0.0–0.2)

## 2023-05-20 LAB — BASIC METABOLIC PANEL
Anion gap: 8 (ref 5–15)
BUN: 15 mg/dL (ref 8–23)
CO2: 25 mmol/L (ref 22–32)
Calcium: 8.2 mg/dL — ABNORMAL LOW (ref 8.9–10.3)
Chloride: 106 mmol/L (ref 98–111)
Creatinine, Ser: 1.07 mg/dL (ref 0.61–1.24)
GFR, Estimated: >60 mL/min (ref >60)
Glucose, Bld: 102 mg/dL — ABNORMAL HIGH (ref 70–99)
Potassium: 3.9 mmol/L (ref 3.5–5.1)
Sodium: 139 mmol/L (ref 135–145)

## 2023-05-20 LAB — PHOSPHORUS: Phosphorus: 3.6 mg/dL (ref 2.5–4.6)

## 2023-05-20 MED ORDER — BUDESONIDE 0.5 MG/2ML IN SUSP
0.5000 mg | Freq: Two times a day (BID) | RESPIRATORY_TRACT | Status: DC
  Administered 2023-05-20 – 2023-05-23 (×6): 0.5 mg via RESPIRATORY_TRACT
  Filled 2023-05-20 (×7): qty 2

## 2023-05-20 NOTE — Progress Notes (Signed)
Mobility Specialist Progress Note:   05/20/23 1104  Mobility  Activity Ambulated independently in hallway  Level of Assistance Standby assist, set-up cues, supervision of patient - no hands on  Assistive Device None  Distance Ambulated (ft) 120 ft  Activity Response Tolerated well  Mobility Referral Yes  Mobility visit 1 Mobility  Mobility Specialist Start Time (ACUTE ONLY) 1055  Mobility Specialist Stop Time (ACUTE ONLY) 1104  Mobility Specialist Time Calculation (min) (ACUTE ONLY) 9 min   Pt received in bed, reporting feeling worse today than yesterday. Ambulated in hallway, while monitoring O2. No AD required. SpO2 96% on RA throughout session, however pt quickly fatigued and had significant SOB throughout.Took 2 standing rest breaks to recover, returned pt to room, sitting EOB, all needs met, call bell in reach.    Feliciana Rossetti Mobility Specialist Please contact via Special educational needs teacher or  Rehab office at [phone]

## 2023-05-20 NOTE — Progress Notes (Signed)
[facility]                                                                              Eugene Patel, is a 72 y.o. male, DOB - [birth year], QMV:[id number] Admit date - 05/16/2023    Outpatient Primary MD for the patient is Tillman Abide I, MD  LOS - 4  days  Chief Complaint  Patient presents with   Shortness of Breath            Brief summary   Patient is a 72 year old male with COPD, former tobacco use, quit 1 year ago presented to ED with sudden onset of shortness of breath around 3 AM on the day of admission, no chest pain.  In ED, was noted to be tripoding, chest x-ray showed hyperinflation with emphysema, groundglass opacities in the left peripheral lung base possible pneumonia.  Patient was placed on empiric IV antibiotics. In ED, temp 97.7 F, BP 90/58, pulse 96, O2 sats 90% on room air, RR 26   Assessment & Plan    Principal Problem:   Sepsis (HCC), CAP PNA, dyspnea pneumococcal pneumonia -Patient met Sepsis criteria due to borderline BP, tachycardia, leukocytosis, lactic acidosis, elevated procalcitonin, source likely due to community-acquired pneumonia -CTA negative for PE. -COVID-negative, flu, RSV negative respiratory virus panel negative -Blood cultures negative so far, urine strep antigen positive  -Urine Legionella antigen in process -Lactic acid 5.0 on admission, improved to 2.6 -Pulmonology following. -2D echo showed EF of 60 to 65%, no regional WMA -Feeling wheezy and winded today.  Increased Pulmicort 2.5 mg twice daily -Continue scheduled nebs, IV steroids, IV Rocephin  Active Problems:   COPD (chronic obstructive pulmonary disease) with emphysema (HCC) - continue Xopenex, Atrovent nebs every 4 hours, Pulmicort, Brovana -Continue IV Solu-Medrol 80 mg daily, will transition to oral prednisone with taper upon discharge -Home O2 evaluation prior to discharge -Quit smoking 1 year ago -Wheezing today  Hypophosphatemia -Continue  Neutra-Phos twice daily   Mild hypovolemic hyponatremia -Received IV fluid hydration -Improved   Estimated body mass index is 19.73 kg/m as calculated from the following:   Height as of this encounter: 6' (1.829 m).   Weight as of this encounter: 66 kg.  Code Status: Full code DVT Prophylaxis:  enoxaparin (LOVENOX) injection 40 mg Start: 05/17/23 1000   Level of Care: Level of care: Telemetry Medical Family Communication: Updated patient's wife at the bedside yesterday Disposition Plan:      Remains inpatient appropriate:    Procedures:  None  Consultants:   Pulm   Antimicrobials:   Anti-infectives (From admission, onward)    Start     Dose/Rate Route Frequency Ordered Stop   05/18/23 1800  azithromycin (ZITHROMAX) tablet 500 mg  Status:  Discontinued        500 mg Oral Daily-1800 05/18/23 0950 05/19/23 0801   05/17/23 1830  azithromycin (ZITHROMAX) 500 mg in sodium chloride 0.9 % 250 mL IVPB  Status:  Discontinued        500 mg 250 mL/hr over 60 Minutes Intravenous Every 24 hours 05/17/23 0036 05/18/23 0950   05/17/23 1800  cefTRIAXone (ROCEPHIN) 2 g  in sodium chloride 0.9 % 100 mL IVPB        2 g 200 mL/hr over 30 Minutes Intravenous Every 24 hours 05/17/23 0036 05/23/23 1759   05/16/23 2130  cefTRIAXone (ROCEPHIN) 2 g in sodium chloride 0.9 % 100 mL IVPB        2 g 200 mL/hr over 30 Minutes Intravenous  Once 05/16/23 2117 05/16/23 2201   05/16/23 2130  azithromycin (ZITHROMAX) 500 mg in sodium chloride 0.9 % 250 mL IVPB        500 mg 250 mL/hr over 60 Minutes Intravenous  Once 05/16/23 2117 05/16/23 2349          Medications  arformoterol  15 mcg Nebulization BID   budesonide (PULMICORT) nebulizer solution  0.5 mg Nebulization BID   enoxaparin (LOVENOX) injection  40 mg Subcutaneous Q24H   levalbuterol  0.63 mg Nebulization QID   And   ipratropium  0.5 mg Nebulization QID   methylPREDNISolone (SOLU-MEDROL) injection  80 mg Intravenous Daily   phosphorus   500 mg Oral BID      Subjective:   Eugene Patel was seen and examined today.  Not feeling too well today, short of breath, winded and wheezing today.  No fevers or chills.    Objective:   Vitals:   05/19/23 2015 05/19/23 2019 05/20/23 0432 05/20/23 0931  BP:  120/75 109/68 134/88  Pulse: 85 81 67 87  Resp: 16 20 18 18   Temp:  98.1 F (36.7 C) 98.2 F (36.8 C) 97.8 F (36.6 C)  TempSrc:    Oral  SpO2: 95% 100% 97% 100%  Weight:      Height:       No intake or output data in the 24 hours ending 05/20/23 1213    Wt Readings from Last 3 Encounters:  05/17/23 66 kg  05/01/23 67 kg  08/22/22 66.2 kg    Physical Exam General: Alert and oriented x 3, NAD Cardiovascular: S1 S2 clear, RRR.  Respiratory: Diminished breath sounds with expiratory wheezing bilaterally Gastrointestinal: Soft, nontender, nondistended, NBS Ext: no pedal edema bilaterally Neuro: no new deficits Psych: Normal affect   Data Reviewed:  I have personally reviewed following labs    CBC Lab Results  Component Value Date   WBC 12.1 (H) 05/20/2023   RBC 4.09 (L) 05/20/2023   HGB 13.0 05/20/2023   HCT 38.8 (L) 05/20/2023   MCV 94.9 05/20/2023   MCH 31.8 05/20/2023   PLT 271 05/20/2023   MCHC 33.5 05/20/2023   RDW 13.5 05/20/2023   LYMPHSABS 0.9 05/16/2023   MONOABS 1.8 (H) 05/16/2023   EOSABS 0.0 05/16/2023   BASOSABS 0.1 05/16/2023     Last metabolic panel Lab Results  Component Value Date   NA 139 05/20/2023   K 3.9 05/20/2023   CL 106 05/20/2023   CO2 25 05/20/2023   BUN 15 05/20/2023   CREATININE 1.07 05/20/2023   GLUCOSE 102 (H) 05/20/2023   GFRNONAA >60 05/20/2023   GFRAA  02/21/2009    >60        The eGFR has been calculated using the MDRD equation. This calculation has not been validated in all clinical situations. eGFR's persistently <60 mL/min signify possible Chronic Kidney Disease.   CALCIUM 8.2 (L) 05/20/2023   PHOS 3.6 05/20/2023   PROT 6.6 05/16/2023    ALBUMIN 3.6 05/16/2023   BILITOT 2.7 (H) 05/16/2023   ALKPHOS 68 05/16/2023   AST 26 05/16/2023   ALT 18 05/16/2023  ANIONGAP 8 05/20/2023    CBG (last 3)  No results for input(s): "GLUCAP" in the last 72 hours.    Coagulation Profile: Recent Labs  Lab 05/16/23 1833  INR 1.1     Radiology Studies: I have personally reviewed the imaging studies  No results found.     Thad Ranger M.D. [facility] 05/20/2023, 12:13 PM  Available via Epic secure chat 7am-7pm After 7 pm, please refer to night coverage provider listed on amion.

## 2023-05-20 NOTE — Plan of Care (Signed)

## 2023-05-21 ENCOUNTER — Inpatient Hospital Stay (HOSPITAL_COMMUNITY): Payer: Managed Care, Other (non HMO)

## 2023-05-21 DIAGNOSIS — J438 Other emphysema: Secondary | ICD-10-CM | POA: Diagnosis not present

## 2023-05-21 DIAGNOSIS — J189 Pneumonia, unspecified organism: Secondary | ICD-10-CM | POA: Diagnosis not present

## 2023-05-21 DIAGNOSIS — J441 Chronic obstructive pulmonary disease with (acute) exacerbation: Secondary | ICD-10-CM | POA: Diagnosis not present

## 2023-05-21 LAB — CULTURE, BLOOD (ROUTINE X 2)
Culture: NO GROWTH
Culture: NO GROWTH
Special Requests: ADEQUATE

## 2023-05-21 NOTE — Plan of Care (Signed)

## 2023-05-21 NOTE — Progress Notes (Signed)
Mobility Specialist Progress Note:    05/21/23 1123  Mobility  Activity Ambulated with assistance in hallway  Level of Assistance Modified independent, requires aide device or extra time  Assistive Device Other (Comment) (hand rails)  Distance Ambulated (ft) 90 ft  Activity Response Tolerated well  Mobility Referral Yes  Mobility visit 1 Mobility  Mobility Specialist Start Time (ACUTE ONLY) 1110  Mobility Specialist Stop Time (ACUTE ONLY) 1123  Mobility Specialist Time Calculation (min) (ACUTE ONLY) 13 min   Pt received in bed, agreeable to mobility session. Ambulated in hallway using ModI with hand rails. Audible SOB throughout, took one standing rest break before returning to room. SpO2 92-96% on RA throughout. Pt c/o SOB and fatigue. Returned pt to room, all needs met, call bell in reach.    Feliciana Rossetti Mobility Specialist Please contact via Special educational needs teacher or  Rehab office at [phone]

## 2023-05-21 NOTE — Progress Notes (Signed)
SATURATION QUALIFICATIONS: (This note is used to comply with regulatory documentation for home oxygen)  Patient Saturations on Room Air at Rest = 98%  Patient Saturations on Room Air while Ambulating = 97%  Pt does not require oxygen  Eugene Patel

## 2023-05-21 NOTE — Progress Notes (Signed)
Occupational Therapy Treatment Patient Details Name: Eugene Patel MRN: [id number] DOB: [birth year] Today's Date: 05/21/2023   History of present illness 72 y.o. male presents to the ED 12/3 with sudden onset shortness of breath, dx community-acquired pneumonia. PMHx: COPD, former tobacco user, quit 1 year ago.   OT comments  Pt. Seen for skilled OT treatment session.  Provided and reviewed energy conservation handouts.  Examples provided with emphasis on management of adls with modifications that allow increased amount of activity tolerance.  Pt. Receptive to education and reports he has some of the strategies already in place.  Reports he has been sitting up more throughout the day and taking short distance ambulation trips in his room to/from the door.  Aware of when he needs to take a rest break and does not try to push past when he needs to rest.  Cont. With current acute OT POC with cont. Work with adl completion and and increasing activity tolerance/endurance.        If plan is discharge home, recommend the following:      Equipment Recommendations  None recommended by OT    Recommendations for Other Services      Precautions / Restrictions Precautions Precautions: Fall Precaution Comments: monitor O2 Restrictions Weight Bearing Restrictions: No       Mobility Bed Mobility                    Transfers                         Balance                                           ADL either performed or assessed with clinical judgement   ADL                                         General ADL Comments: provided and reviewed energy conservation handouts. examples provided for modification of adls to allow increased activity tolerance with focus on paying attention to feelings of sob. pt. receptive to education and states he has some strategies already in place.  reviewed benefits of cont. to increase activity to gain  endurance/tolerance.  pt. reports he has been sitting eob multiple times a day and walking to/from door several times a day also. states "it wears me out but i do it".    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  Eager and hopeful to get back to driving when able.      Pertinent Vitals/ Pain  Pain Assessment Pain Assessment: No/denies pain  Home Living                                          Prior Functioning/Environment              Frequency  Min 1X/week        Progress Toward Goals  OT Goals(current goals can now be found in the care plan section)  Progress towards OT goals: Progressing toward goals     Plan      Co-evaluation                 AM-PAC OT "6 Clicks" Daily Activity     Outcome Measure   Help from another person eating meals?: None Help from another person taking care of personal grooming?: None Help from another person toileting, which includes using toliet, bedpan, or urinal?: None Help from another person bathing (including washing, rinsing, drying)?: None Help from another person to put on and taking off regular upper body clothing?: None Help from another person to put on and taking off regular lower body clothing?: None 6 Click Score: 24    End of Session    OT Visit Diagnosis: Unsteadiness on feet (R26.81);Other (comment)   Activity Tolerance Patient tolerated treatment well   Patient Left in bed;with call bell/phone within reach   Nurse Communication          Time: 8756-4332 OT Time  Calculation (min): 14 min  Charges: OT General Charges $OT Visit: 1 Visit OT Treatments $Self Care/Home Management : 8-22 mins  Boneta Lucks, COTA/L Acute Rehabilitation [phone]   Alessandra Bevels Lorraine-COTA/L 05/21/2023, 10:32 AM

## 2023-05-21 NOTE — Progress Notes (Signed)
[facility]                                                                              Eugene Patel, is a 72 y.o. male, DOB - [birth year], ZOX:[id number] Admit date - 05/16/2023    Outpatient Primary MD for the patient is Eugene Abide I, MD  LOS - 5  days  Chief Complaint  Patient presents with   Shortness of Breath            Brief summary   Patient is a 72 year old male with COPD, former tobacco use, quit 1 year ago presented to ED with sudden onset of shortness of breath around 3 AM on the day of admission, no chest pain.  In ED, was noted to be tripoding, chest x-ray showed hyperinflation with emphysema, groundglass opacities in the left peripheral lung base possible pneumonia.  Patient was placed on empiric IV antibiotics. In ED, temp 97.7 F, BP 90/58, pulse 96, O2 sats 90% on room air, RR 26   Assessment & Plan    Principal Problem:   Sepsis (HCC), POA CAP PNA, pneumococcal pneumonia -Patient met Sepsis criteria due to borderline BP, tachycardia, leukocytosis, lactic acidosis, elevated procalcitonin, source likely due to community-acquired pneumonia -CTA negative for PE. -COVID-negative, flu, RSV negative respiratory virus panel negative -Blood cultures negative so far, urine strep antigen positive  -Urine Legionella antigen in process -Lactic acid 5.0 on admission, improved to 2.6 -Pulmonology following. -2D echo showed EF of 60 to 65%, no regional WMA -Continues to feel short of breath on ambulation, continue nebs, IV's Solu-Medrol, Pulmicort, Brovana, IV Rocephin  -Obtain home O2 evaluation, chest x-ray to assess any worsening  Active Problems:   COPD (chronic obstructive pulmonary disease) with emphysema (HCC) - continue Xopenex, Atrovent nebs scheduled, Pulmicort, Brovana -Continue IV Solu-Medrol 80 mg daily, will transition to oral prednisone with taper upon discharge -Quit smoking 1 year ago -Home O2  evaluation  Hypophosphatemia -Continue Neutra-Phos twice daily   Mild hypovolemic hyponatremia -Received IV fluid hydration -Improved   Estimated body mass index is 19.73 kg/m as calculated from the following:   Height as of this encounter: 6' (1.829 m).   Weight as of this encounter: 66 kg.  Code Status: Full code DVT Prophylaxis:  enoxaparin (LOVENOX) injection 40 mg Start: 05/17/23 1000   Level of Care: Level of care: Telemetry Medical Family Communication: Updated patient Disposition Plan:      Remains inpatient appropriate: Await chest x-ray, pulmonology to reevaluate   Procedures:  None  Consultants:   Pulm   Antimicrobials:   Anti-infectives (From admission, onward)    Start     Dose/Rate Route Frequency Ordered Stop   05/18/23 1800  azithromycin (ZITHROMAX) tablet 500 mg  Status:  Discontinued        500 mg Oral Daily-1800 05/18/23 0950 05/19/23 0801   05/17/23 1830  azithromycin (ZITHROMAX) 500 mg in sodium chloride 0.9 % 250 mL IVPB  Status:  Discontinued        500 mg 250 mL/hr over 60 Minutes Intravenous Every 24 hours 05/17/23 0036 05/18/23 0950   05/17/23 1800  cefTRIAXone (ROCEPHIN) 2  g in sodium chloride 0.9 % 100 mL IVPB        2 g 200 mL/hr over 30 Minutes Intravenous Every 24 hours 05/17/23 0036 05/23/23 1759   05/16/23 2130  cefTRIAXone (ROCEPHIN) 2 g in sodium chloride 0.9 % 100 mL IVPB        2 g 200 mL/hr over 30 Minutes Intravenous  Once 05/16/23 2117 05/16/23 2201   05/16/23 2130  azithromycin (ZITHROMAX) 500 mg in sodium chloride 0.9 % 250 mL IVPB        500 mg 250 mL/hr over 60 Minutes Intravenous  Once 05/16/23 2117 05/16/23 2349          Medications  arformoterol  15 mcg Nebulization BID   budesonide (PULMICORT) nebulizer solution  0.5 mg Nebulization BID   enoxaparin (LOVENOX) injection  40 mg Subcutaneous Q24H   levalbuterol  0.63 mg Nebulization QID   And   ipratropium  0.5 mg Nebulization QID   methylPREDNISolone  (SOLU-MEDROL) injection  80 mg Intravenous Daily   phosphorus  500 mg Oral BID      Subjective:   Eugene Patel was seen and examined today.  States does not feel a whole lot better, still feels winded on ambulation.  No acute chest pain, fevers or chills.   Objective:   Vitals:   05/20/23 1651 05/20/23 2150 05/21/23 0441 05/21/23 0959  BP: 118/80 119/78 128/79 (!) 132/90  Pulse: 90 81 75 86  Resp: 20 18  18   Temp: 99.3 F (37.4 C) 97.9 F (36.6 C) 98.2 F (36.8 C) 98 F (36.7 C)  TempSrc: Oral Oral Oral   SpO2:  97% 96% 96%  Weight:      Height:        Intake/Output Summary (Last 24 hours) at 05/21/2023 1248 Last data filed at 05/21/2023 0900 Gross per 24 hour  Intake 240 ml  Output 250 ml  Net -10 ml      Wt Readings from Last 3 Encounters:  05/17/23 66 kg  05/01/23 67 kg  08/22/22 66.2 kg   Physical Exam General: Alert and oriented x 3, NAD Cardiovascular: S1 S2 clear, RRR.  Respiratory: Diminished breath sounds with faint expiratory wheezing Gastrointestinal: Soft, nontender, nondistended, NBS Ext: no pedal edema bilaterally Neuro: no new deficits Psych: Normal affect    Data Reviewed:  Patel have personally reviewed following labs    CBC Lab Results  Component Value Date   WBC 12.1 (H) 05/20/2023   RBC 4.09 (L) 05/20/2023   HGB 13.0 05/20/2023   HCT 38.8 (L) 05/20/2023   MCV 94.9 05/20/2023   MCH 31.8 05/20/2023   PLT 271 05/20/2023   MCHC 33.5 05/20/2023   RDW 13.5 05/20/2023   LYMPHSABS 0.9 05/16/2023   MONOABS 1.8 (H) 05/16/2023   EOSABS 0.0 05/16/2023   BASOSABS 0.1 05/16/2023     Last metabolic panel Lab Results  Component Value Date   NA 139 05/20/2023   K 3.9 05/20/2023   CL 106 05/20/2023   CO2 25 05/20/2023   BUN 15 05/20/2023   CREATININE 1.07 05/20/2023   GLUCOSE 102 (H) 05/20/2023   GFRNONAA >60 05/20/2023   GFRAA  02/21/2009    >60        The eGFR has been calculated using the MDRD equation. This calculation has not  been validated in all clinical situations. eGFR's persistently <60 mL/min signify possible Chronic Kidney Disease.   CALCIUM 8.2 (L) 05/20/2023   PHOS 3.6 05/20/2023   PROT 6.6  05/16/2023   ALBUMIN 3.6 05/16/2023   BILITOT 2.7 (H) 05/16/2023   ALKPHOS 68 05/16/2023   AST 26 05/16/2023   ALT 18 05/16/2023   ANIONGAP 8 05/20/2023    CBG (last 3)  No results for input(s): "GLUCAP" in the last 72 hours.    Coagulation Profile: Recent Labs  Lab 05/16/23 1833  INR 1.1     Radiology Studies: Patel have personally reviewed the imaging studies  No results found.     Thad Ranger M.D. [facility] 05/21/2023, 12:48 PM  Available via Epic secure chat 7am-7pm After 7 pm, please refer to night coverage provider listed on amion.

## 2023-05-22 DIAGNOSIS — J441 Chronic obstructive pulmonary disease with (acute) exacerbation: Secondary | ICD-10-CM | POA: Diagnosis not present

## 2023-05-22 DIAGNOSIS — J189 Pneumonia, unspecified organism: Secondary | ICD-10-CM | POA: Diagnosis not present

## 2023-05-22 DIAGNOSIS — J438 Other emphysema: Secondary | ICD-10-CM | POA: Diagnosis not present

## 2023-05-22 DIAGNOSIS — E871 Hypo-osmolality and hyponatremia: Secondary | ICD-10-CM

## 2023-05-22 DIAGNOSIS — J13 Pneumonia due to Streptococcus pneumoniae: Secondary | ICD-10-CM

## 2023-05-22 LAB — BASIC METABOLIC PANEL
Anion gap: 9 (ref 5–15)
BUN: 18 mg/dL (ref 8–23)
CO2: 24 mmol/L (ref 22–32)
Calcium: 8.1 mg/dL — ABNORMAL LOW (ref 8.9–10.3)
Chloride: 105 mmol/L (ref 98–111)
Creatinine, Ser: 1 mg/dL (ref 0.61–1.24)
GFR, Estimated: >60 mL/min (ref >60)
Glucose, Bld: 92 mg/dL (ref 70–99)
Potassium: 3.7 mmol/L (ref 3.5–5.1)
Sodium: 138 mmol/L (ref 135–145)

## 2023-05-22 LAB — LEGIONELLA PNEUMOPHILA SEROGP 1 UR AG: L. pneumophila Serogp 1 Ur Ag: NEGATIVE

## 2023-05-22 LAB — CBC
HCT: 41.7 % (ref 39.0–52.0)
Hemoglobin: 13.8 g/dL (ref 13.0–17.0)
MCH: 31.4 pg (ref 26.0–34.0)
MCHC: 33.1 g/dL (ref 30.0–36.0)
MCV: 95 fL (ref 80.0–100.0)
Platelets: 279 10*3/uL (ref 150–400)
RBC: 4.39 MIL/uL (ref 4.22–5.81)
RDW: 13.3 % (ref 11.5–15.5)
WBC: 15.1 10*3/uL — ABNORMAL HIGH (ref 4.0–10.5)
nRBC: 0 % (ref 0.0–0.2)

## 2023-05-22 LAB — STREP PNEUMONIAE URINARY ANTIGEN: Strep Pneumo Urinary Antigen: NEGATIVE

## 2023-05-22 MED ORDER — REVEFENACIN 175 MCG/3ML IN SOLN: 175.0000 ug | Freq: Every day | RESPIRATORY_TRACT | Status: DC

## 2023-05-22 MED ORDER — GUAIFENESIN ER 600 MG PO TB12
600.0000 mg | ORAL_TABLET | Freq: Two times a day (BID) | ORAL | Status: DC
  Administered 2023-05-22 – 2023-05-23 (×3): 600 mg via ORAL
  Filled 2023-05-22 (×3): qty 1

## 2023-05-22 MED ORDER — FUROSEMIDE 10 MG/ML IJ SOLN
40.0000 mg | Freq: Once | INTRAMUSCULAR | Status: AC
  Administered 2023-05-22: 40 mg via INTRAVENOUS
  Filled 2023-05-22: qty 4

## 2023-05-22 MED ORDER — REVEFENACIN 175 MCG/3ML IN SOLN
175.0000 ug | Freq: Every day | RESPIRATORY_TRACT | Status: DC
  Administered 2023-05-23: 175 ug via RESPIRATORY_TRACT
  Filled 2023-05-22: qty 3

## 2023-05-22 MED ORDER — METHYLPREDNISOLONE SODIUM SUCC 40 MG IJ SOLR
40.0000 mg | Freq: Every day | INTRAMUSCULAR | Status: DC
  Administered 2023-05-23: 40 mg via INTRAVENOUS
  Filled 2023-05-22: qty 1

## 2023-05-22 NOTE — Plan of Care (Signed)

## 2023-05-22 NOTE — Plan of Care (Signed)
  Problem: Education: Goal: Knowledge of General Education information will improve Description Including pain rating scale, medication(s)/side effects and non-pharmacologic comfort measures Outcome: Progressing   Problem: Health Behavior/Discharge Planning: Goal: Ability to manage health-related needs will improve Outcome: Progressing   

## 2023-05-22 NOTE — Progress Notes (Signed)
Mobility Specialist: Progress Note   05/22/23 1506  Mobility  Activity Ambulated with assistance in hallway  Level of Assistance Standby assist, set-up cues, supervision of patient - no hands on  Assistive Device Front wheel walker  Distance Ambulated (ft) 150 ft  Activity Response Tolerated well  Mobility Referral Yes  Mobility visit 1 Mobility  Mobility Specialist Start Time (ACUTE ONLY) 1233  Mobility Specialist Stop Time (ACUTE ONLY) 1243  Mobility Specialist Time Calculation (min) (ACUTE ONLY) 10 min    Pt was gareeable to mobility session - received on EOB. No complaints. SpO2 WFL on RA throughout but pt SOB needing standing breaks (x3). Completed speed interval activity as well as marches to increase intensity of activity. Returned to room without fault. Left on EOB with all needs met, call bell in reach.   Maurene Capes Mobility Specialist Please contact via SecureChat or Rehab office at [phone]

## 2023-05-22 NOTE — Progress Notes (Addendum)
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 Presents to ER 12/3 w/ cc shortness of breath x 2 days. This occurred after contracting dry cough, runny nose and sore throat on long car ride from [place].  -CT chest neg for PE. Showed svr emphysema. Chronic right apex scarring and new LLL infiltrate  Respiratory viral panel was negative, COVID, flu and RSV was negative PCCM consulted  Pertinent  Medical History  COPD stage 4 Former tobacco abuse (stopped 1 yr ago)  Significant Hospital Events: Including procedures, antibiotic start and stop dates in addition to other pertinent events   Admission 05/16/2023  Interim History / Subjective:  Feels better than on admission but has worsening dyspnea with exertion that is not his baseline He is on RA, he maintained sats > 92% while I was with him today Afebrile, but increase in WBC overnight to 15 from 12.1 Net + 4 L [place]  Echo 05/18/2023 LVF 60-65%, normal function, normal diastolic pressures RV Normal systolic function, Normal PAP , RVSP of 26 Trivial mitral regurgitation, no stenosis Tricuspid valve with trivial regurgitation, no stenosis Aortic valve normal Blood Cx no Growth 12/3 Respiratory viral panel was negative, COVID, flu and RSV was negative Objective   Blood pressure 108/76, pulse 90, temperature 98 F (36.7 C), temperature source Oral, resp. rate 18, height 6' (1.829 m), weight 66 kg, SpO2 93%.        Intake/Output Summary (Last 24 hours) at 05/22/2023 1019 Last data filed at 05/22/2023 0700 Gross per 24 hour  Intake 720 ml  Output 100 ml  Net 620 ml   Filed Weights   05/17/23 1653  Weight: 66 kg    Examination: General: Elderly, thin man, in NAD sitting on side of  bed HENT: Mild pallor, no icterus, no JVD, No LAD Lungs: Bilateral chest excursion, Decreased breath sounds bilateral,ly no accessory muscle use at present, at rest,  Cardiovascular: S1-S2 regular, no murmur, pulse 90 Abdomen: Soft, nontender, no hepatosplenomegaly, ND, BS +,Body mass index is 19.73 kg/m.  Extremities: No edema, no deformity Neuro: Alert, interactive, nonfocal  Labs show normal electrolytes, lactate peaked at 5.0, down to 2.6, high procalcitonin, leukocytosis Strep Pneumonia + on 12/5 Urine Legionella pending  CTA 05/16/2023  No PE, severe emphysema, chronic right apex scarring New interstitial and alveolar infiltrates in the left lung base probably representing pneumonia.  Resolved Hospital Problem list     Assessment & Plan:  Severe COPD/emphysema -FEV1 24% Left lower lobe community-acquired pneumonia Urine Strep Antigen + 12/5 Elevated BNP, 12/4,but normal Echo Net + 4.7 L Plan Continue treating  with ceftriaxone/azithromycin, await urine strep antigen and Legionella Continue budesonide/Brovana combination and lieu of home Breztri with Xopenex for breakthrough. Continued Expiratory Wheezing, on Solu-Medrol 80 every 24 and this can be tapered and discontinued with improvement  Lasix 40 mg x 1 on 12/9 BNP  12/10 Continue Flutter valve and IS every 1 hour while awake. Instructions done for both. Primary pulmonologist had ordered esfentrine/PDE 4/5 antagonist-he has not received this yet from specialty pharmacy>> Will need upon DC Expect slow improvement over the next 3 to 4 days. PCCM will be available as needed Would not recommend discharge today with increasing WBC and  dyspnea on exertion.  Pt is in agreement with this plan.  Best Practice (right click and "Reselect all SmartList Selections" daily)    Code Status:  full code Last date of multidisciplinary goals of care discussion [NA]  Labs   CBC: Recent Labs  Lab 05/16/23 1833 05/16/23 1843  05/17/23 0358 05/18/23 0329 05/19/23 0613 05/20/23 0310 05/22/23 0350  WBC 24.9*  --  19.6* 18.8* 13.2* 12.1* 15.1*  NEUTROABS 21.9*  --   --   --   --   --   --   HGB 16.2   < > 14.3 12.8* 12.3* 13.0 13.8  HCT 47.2   < > 42.3 38.6* 37.2* 38.8* 41.7  MCV 91.8  --  93.8 92.8 95.1 94.9 95.0  PLT 289  --  248 255 247 271 279   < > = values in this interval not displayed.    Basic Metabolic Panel: Recent Labs  Lab 05/17/23 0358 05/18/23 0329 05/19/23 0613 05/20/23 0310 05/22/23 0350  NA 135 138 139 139 138  K 3.7 4.2 4.1 3.9 3.7  CL 106 106 107 106 105  CO2 20* 23 25 25 24   GLUCOSE 157* 128* 98 102* 92  BUN 11 17 15 15 18   CREATININE 1.02 1.05 1.14 1.07 1.00  CALCIUM 8.3* 8.5* 8.2* 8.2* 8.1*  MG 2.4  --   --   --   --   PHOS 1.0* 3.8 3.7 3.6  --    GFR: Estimated Creatinine Clearance: 60.5 mL/min (by C-G formula based on SCr of 1 mg/dL). Recent Labs  Lab 05/16/23 1839 05/16/23 2028 05/16/23 2335 05/17/23 0358 05/17/23 0659 05/17/23 1513 05/18/23 0329 05/19/23 0613 05/20/23 0310 05/22/23 0350  PROCALCITON  --   --   --   --  4.61  --   --  1.57  --   --   WBC  --   --   --    < >  --   --  18.8* 13.2* 12.1* 15.1*  LATICACIDVEN 2.3* 4.3* 5.0*  --   --  2.6*  --   --   --   --    < > = values in this interval not displayed.    Liver Function Tests: Recent Labs  Lab 05/16/23 1833  AST 26  ALT 18  ALKPHOS 68  BILITOT 2.7*  PROT 6.6  ALBUMIN 3.6   No results for input(s): "LIPASE", "AMYLASE" in the last 168 hours. No results for input(s): "AMMONIA" in the last 168 hours.  ABG    Component Value Date/Time   TCO2 21 (L) 05/16/2023 2032     Coagulation Profile: Recent Labs  Lab 05/16/23 1833  INR 1.1    Cardiac Enzymes: No results for input(s): "CKTOTAL", "CKMB", "CKMBINDEX", "TROPONINI" in the last 168 hours.  HbA1C: No results found for: "HGBA1C"  CBG: No results for input(s): "GLUCAP" in the last 168 hours.  Allergies No Known Allergies    Home Medications  Prior to Admission medications   Medication Sig Start Date End Date Taking? Authorizing Provider  albuterol (PROVENTIL) (2.5 MG/3ML) 0.083% nebulizer solution TAKE 3 MLS BY NEBULIZATION EVERY 6 HOURSAS NEEDED FOR WHEEZING OR SHORTNESS OF BREATH 07/25/22  Yes Salena Saner, MD  albuterol (VENTOLIN HFA) 108 (90 Base) MCG/ACT inhaler INHALE 2 PUFFS INTO THE LUNGS 3 TIMES DAILY AS NEEDED 07/15/22  Yes Karie Schwalbe, MD  BREZTRI AEROSPHERE 160-9-4.8 MCG/ACT AERO INHALE TWO PUFFS INTO THE LUNGS IN THE MORNING AND  AT BEDTIME 11/10/22  Yes Salena Saner, MD  diphenhydramine-acetaminophen (TYLENOL PM) 25-500 MG TABS tablet Take 1 tablet by mouth at bedtime as needed.   Yes [provider]  Ensifentrine (OHTUVAYRE) 3 MG/2.5ML SUSP Take 3 mg by nebulization 2 (two) times daily. 05/01/23  Yes Salena Saner, MD  ipratropium-albuterol (DUONEB) 0.5-2.5 (3) MG/3ML SOLN Take 3 mLs by nebulization 4 (four) times daily. 02/28/22  Yes Salena Saner, MD  triamcinolone cream (KENALOG) 0.1 % APPLY TOPICALLY TWICE DAILY AS NEEDED. 01/03/22  Yes Karie Schwalbe, MD    25 minutes Pulmonary APP Time   Bevelyn Ngo, MSN, AGACNP-BC [place] [facility] See Amion for personal pager PCCM on call pager [phone]     If no response to pager , please call [phone] until 7 pm After 7:00 pm call Elink  [phone]   05/22/2023

## 2023-05-22 NOTE — Progress Notes (Signed)
[facility]                                                                              Eugene Patel, is a 72 y.o. male, DOB - 1950/08/10, ZOX:[id number] Admit date - 05/16/2023    Outpatient Primary MD for the patient is Karie Schwalbe, MD  LOS - 6  days  Chief Complaint  Patient presents with   Shortness of Breath            Brief summary   Patient is a 72 year old male with COPD, former tobacco use, quit 1 year ago presented to ED with sudden onset of shortness of breath around 3 AM on the day of admission, no chest pain.  In ED, was noted to be tripoding, chest x-ray showed hyperinflation with emphysema, groundglass opacities in the left peripheral lung base possible pneumonia.  Patient was placed on empiric IV antibiotics. In ED, temp 97.7 F, BP 90/58, pulse 96, O2 sats 90% on room air, RR 26   Assessment & Plan    Principal Problem:   Sepsis (HCC), CAP PNA, dyspnea pneumococcal pneumonia -Patient met Sepsis criteria due to borderline BP, tachycardia, leukocytosis, lactic acidosis, elevated procalcitonin, source likely due to community-acquired pneumonia -CTA negative for PE. -COVID-negative, flu, RSV negative respiratory virus panel negative -Blood cultures negative so far, urine strep antigen positive  -Urine Legionella antigen negative -Lactic acid 5.0 on admission, improved to 2.6 -2D echo showed EF of 60 to 65%, no regional WMA -Appreciate pulmonology recommendations, Lasix 40 mg IV x 1, will reassess in a.m. -continue current management, tapering IV Solu-Medrol to 40 mg daily, will place on prednisone with taper upon discharge.   -Continue IV Rocephin, Pulmicort, Rosalyn Gess, Yupelri  -Home O2 evaluation  -Outpatient follow-up with Dr. Jayme Cloud in 7 to 10 days    Active Problems:   COPD (chronic obstructive pulmonary disease) with emphysema (HCC) - continue Xopenex, Atrovent nebs scheduled, Pulmicort, Brovana -Tapered IV Solu-Medrol to 40 mg  daily, will transition to oral prednisone with taper upon discharge -Quit smoking 1 year ago -Home O2 evaluation  Hypophosphatemia -Continue Neutra-Phos twice daily   Mild hypovolemic hyponatremia -Received IV fluid hydration -Improved   Estimated body mass index is 19.73 kg/m as calculated from the following:   Height as of this encounter: 6' (1.829 m).   Weight as of this encounter: 66 kg.  Code Status: Full code DVT Prophylaxis:  enoxaparin (LOVENOX) injection 40 mg Start: 05/17/23 1000   Level of Care: Level of care: Med-Surg Family Communication: Updated patient Disposition Plan:      Remains inpatient appropriate: Await chest x-ray, pulmonology to reevaluate   Procedures:  None  Consultants:   Pulm   Antimicrobials:   Anti-infectives (From admission, onward)    Start     Dose/Rate Route Frequency Ordered Stop   05/18/23 1800  azithromycin (ZITHROMAX) tablet 500 mg  Status:  Discontinued        500 mg Oral Daily-1800 05/18/23 0950 05/19/23 0801   05/17/23 1830  azithromycin (ZITHROMAX) 500 mg in sodium chloride 0.9 % 250 mL IVPB  Status:  Discontinued  500 mg 250 mL/hr over 60 Minutes Intravenous Every 24 hours 05/17/23 0036 05/18/23 0950   05/17/23 1800  cefTRIAXone (ROCEPHIN) 2 g in sodium chloride 0.9 % 100 mL IVPB        2 g 200 mL/hr over 30 Minutes Intravenous Every 24 hours 05/17/23 0036 05/23/23 1759   05/16/23 2130  cefTRIAXone (ROCEPHIN) 2 g in sodium chloride 0.9 % 100 mL IVPB        2 g 200 mL/hr over 30 Minutes Intravenous  Once 05/16/23 2117 05/16/23 2201   05/16/23 2130  azithromycin (ZITHROMAX) 500 mg in sodium chloride 0.9 % 250 mL IVPB        500 mg 250 mL/hr over 60 Minutes Intravenous  Once 05/16/23 2117 05/16/23 2349          Medications  arformoterol  15 mcg Nebulization BID   budesonide (PULMICORT) nebulizer solution  0.5 mg Nebulization BID   enoxaparin (LOVENOX) injection  40 mg Subcutaneous Q24H   guaiFENesin  600 mg  Oral BID   levalbuterol  0.63 mg Nebulization QID   And   ipratropium  0.5 mg Nebulization QID   [START ON 05/23/2023] methylPREDNISolone (SOLU-MEDROL) injection  40 mg Intravenous Daily      Subjective:   Eugene Patel was seen and examined today.  Still feels dyspnea on exertion, no acute chest pain, wheezing.  No fevers or chills.  No nausea vomiting, abdominal pain, peripheral edema.  Objective:   Vitals:   05/21/23 1623 05/21/23 2129 05/22/23 0652 05/22/23 0743  BP: 125/85 (!) 126/91 97/71 108/76  Pulse: 90 79 81 90  Resp: 18   18  Temp: 98.1 F (36.7 C) (!) 97.5 F (36.4 C) 98.2 F (36.8 C) 98 F (36.7 C)  TempSrc: Oral Oral Oral Oral  SpO2: 95% 97% 95% 93%  Weight:      Height:        Intake/Output Summary (Last 24 hours) at 05/22/2023 1206 Last data filed at 05/22/2023 0700 Gross per 24 hour  Intake 720 ml  Output 100 ml  Net 620 ml      Wt Readings from Last 3 Encounters:  05/17/23 66 kg  05/01/23 67 kg  08/22/22 66.2 kg    Physical Exam General: Alert and oriented x 3, NAD, thin and ill-appearing Cardiovascular: S1 S2 clear, RRR.  Respiratory: Diminished breath sounds bilaterally, no wheezing Gastrointestinal: Soft, nontender, nondistended, NBS Ext: no pedal edema bilaterally Neuro: no new deficits Psych: Normal affect    Data Reviewed:  I have personally reviewed following labs    CBC Lab Results  Component Value Date   WBC 15.1 (H) 05/22/2023   RBC 4.39 05/22/2023   HGB 13.8 05/22/2023   HCT 41.7 05/22/2023   MCV 95.0 05/22/2023   MCH 31.4 05/22/2023   PLT 279 05/22/2023   MCHC 33.1 05/22/2023   RDW 13.3 05/22/2023   LYMPHSABS 0.9 05/16/2023   MONOABS 1.8 (H) 05/16/2023   EOSABS 0.0 05/16/2023   BASOSABS 0.1 05/16/2023     Last metabolic panel Lab Results  Component Value Date   NA 138 05/22/2023   K 3.7 05/22/2023   CL 105 05/22/2023   CO2 24 05/22/2023   BUN 18 05/22/2023   CREATININE 1.00 05/22/2023   GLUCOSE 92  05/22/2023   GFRNONAA >60 05/22/2023   GFRAA  02/21/2009    >60        The eGFR has been calculated using the MDRD equation. This calculation has not been validated  in all clinical situations. eGFR's persistently <60 mL/min signify possible Chronic Kidney Disease.   CALCIUM 8.1 (L) 05/22/2023   PHOS 3.6 05/20/2023   PROT 6.6 05/16/2023   ALBUMIN 3.6 05/16/2023   BILITOT 2.7 (H) 05/16/2023   ALKPHOS 68 05/16/2023   AST 26 05/16/2023   ALT 18 05/16/2023   ANIONGAP 9 05/22/2023    CBG (last 3)  No results for input(s): "GLUCAP" in the last 72 hours.    Coagulation Profile: Recent Labs  Lab 05/16/23 1833  INR 1.1     Radiology Studies: I have personally reviewed the imaging studies  DG Chest 2 View  Result Date: 05/21/2023 CLINICAL DATA:  Follow up community-acquired pneumonia. EXAM: CHEST - 2 VIEW COMPARISON:  Radiographs 05/16/2023 and 07/18/2022.  CT 05/16/2023. FINDINGS: The heart size and mediastinal contours are stable with aortic atherosclerosis. The lungs remain hyperinflated with stable biapical scarring. The asymmetric interstitial prominence at the left lung base on the recent studies has slightly progressed in the interval, remaining suspicious for acute superimposed inflammation/infection. There is no consolidation, pleural effusion or pneumothorax. The bones appear unchanged. IMPRESSION: Slight progression of asymmetric interstitial prominence at the left lung base, remaining suspicious for acute superimposed inflammation/infection. No consolidation or pleural effusion. Longer-term radiographic follow up recommended. Electronically Signed   By: Carey Bullocks M.D.   On: 05/21/2023 12:59       Rael Tilly M.D. [facility] 05/22/2023, 12:06 PM  Available via Epic secure chat 7am-7pm After 7 pm, please refer to night coverage provider listed on amion.

## 2023-05-22 NOTE — Progress Notes (Signed)
Physical Therapy Treatment Patient Details Name: Eugene Patel MRN: [id number] DOB: 12-05-1950 Today's Date: 05/22/2023   History of Present Illness 72 y.o. male presents to the ED 12/3 with sudden onset shortness of breath, dx community-acquired pneumonia. PMHx: COPD, former tobacco user, quit 1 year ago.    PT Comments  The pt continues to demo progress with endurance and stability. He was able to complete x2 bouts of hallway ambulation without DME, but takes standing rest breaks after ~75 ft due to SOB. No overt LOB, even with balance challenge when fatigued. Will continue to benefit from interval training and LE strengthening to improve endurance, power, and dynamic stability.   5X Sit-to-Stand: 32 sec (> 12.6 sec indicates increased risk of falls for individuals aged 15-79, > 15 sec indicates increased risk of recurrent falls)     If plan is discharge home, recommend the following: Assistance with cooking/housework;Assist for transportation;Help with stairs or ramp for entrance   Can travel by private vehicle        Equipment Recommendations  None recommended by PT    Recommendations for Other Services       Precautions / Restrictions Precautions Precautions: Fall Precaution Comments: monitor O2 Restrictions Weight Bearing Restrictions: No     Mobility  Bed Mobility Overal bed mobility: Modified Independent             General bed mobility comments: extra time    Transfers Overall transfer level: Modified independent Equipment used: None               General transfer comment: Slow to rise but stable once upright.    Ambulation/Gait Ambulation/Gait assistance: Supervision Gait Distance (Feet): 75 Feet (+ 37ft + 75 ft+ 75 ft) Assistive device: None Gait Pattern/deviations: Step-through pattern, Antalgic Gait velocity: decreased and unable to make noticeable changes in gait speed when cued Gait velocity interpretation: <1.31 ft/sec, indicative of  household ambulator   General Gait Details: Minor instability slight antalgic pattern without overt LOB, pt reports close to baseline. SpO2 to 95% on RA, HR 123bpm. x2 walking bouts, pt completing 75 ft then standing rest break then back to room for seated rest. recovers within 2 min.    Balance Overall balance assessment: Needs assistance Sitting-balance support: No upper extremity supported, Feet supported Sitting balance-Leahy Scale: Good     Standing balance support: No upper extremity supported, During functional activity Standing balance-Leahy Scale: Fair   Single Leg Stance - Right Leg: 10 Single Leg Stance - Left Leg: 12 Tandem Stance - Right Leg: 15 Tandem Stance - Left Leg: 15 Rhomberg - Eyes Opened: 15 Rhomberg - Eyes Closed: 15 (increased sway)                Cognition Arousal: Alert Behavior During Therapy: WFL for tasks assessed/performed Overall Cognitive Status: Within Functional Limits for tasks assessed                                          Exercises Other Exercises Other Exercises: speed intervals (10 ft) while ambulaitng x3, no meaningful change in speed Other Exercises: walking marches x10, single UE support on rail Other Exercises: 5x sit-stand from EOB in 32 sec    General Comments General comments (skin integrity, edema, etc.): SpO2 98% on RA at rest, low of 95% on RA with walking, HR to 123bpm      Pertinent  Vitals/Pain Pain Assessment Pain Assessment: No/denies pain Pain Intervention(s): Monitored during session     PT Goals (current goals can now be found in the care plan section) Acute Rehab PT Goals Patient Stated Goal: Get well, return to work. PT Goal Formulation: With patient Time For Goal Achievement: 05/31/23 Potential to Achieve Goals: Good Progress towards PT goals: Progressing toward goals    Frequency    Min 1X/week       AM-PAC PT "6 Clicks" Mobility   Outcome Measure  Help needed turning  from your back to your side while in a flat bed without using bedrails?: None Help needed moving from lying on your back to sitting on the side of a flat bed without using bedrails?: None Help needed moving to and from a bed to a chair (including a wheelchair)?: None Help needed standing up from a chair using your arms (e.g., wheelchair or bedside chair)?: None Help needed to walk in hospital room?: A Little Help needed climbing 3-5 steps with a railing? : A Little 6 Click Score: 22    End of Session Equipment Utilized During Treatment: Gait belt Activity Tolerance: Patient tolerated treatment well Patient left: in bed;with call bell/phone within reach;with family/visitor present Nurse Communication: Mobility status PT Visit Diagnosis: Other abnormalities of gait and mobility (R26.89);Muscle weakness (generalized) (M62.81);Difficulty in walking, not elsewhere classified (R26.2)     Time: 1610-9604 PT Time Calculation (min) (ACUTE ONLY): 25 min  Charges:    $Therapeutic Exercise: 23-37 mins PT General Charges $$ ACUTE PT VISIT: 1 Visit                     Vickki Muff, PT, DPT   Acute Rehabilitation Department Office [phone] Secure Chat Communication Preferred   Ronnie Derby 05/22/2023, 11:49 AM

## 2023-05-23 ENCOUNTER — Telehealth: Payer: Self-pay

## 2023-05-23 DIAGNOSIS — J189 Pneumonia, unspecified organism: Secondary | ICD-10-CM | POA: Diagnosis not present

## 2023-05-23 DIAGNOSIS — J441 Chronic obstructive pulmonary disease with (acute) exacerbation: Secondary | ICD-10-CM | POA: Diagnosis not present

## 2023-05-23 DIAGNOSIS — J13 Pneumonia due to Streptococcus pneumoniae: Secondary | ICD-10-CM | POA: Diagnosis not present

## 2023-05-23 DIAGNOSIS — J438 Other emphysema: Secondary | ICD-10-CM | POA: Diagnosis not present

## 2023-05-23 LAB — BRAIN NATRIURETIC PEPTIDE: B Natriuretic Peptide: 58.8 pg/mL (ref 0.0–100.0)

## 2023-05-23 MED ORDER — GUAIFENESIN ER 600 MG PO TB12: 600.0000 mg | ORAL_TABLET | Freq: Two times a day (BID) | ORAL | 0 refills | Status: DC

## 2023-05-23 MED ORDER — PREDNISONE 10 MG PO TABS
ORAL_TABLET | ORAL | 0 refills | Status: DC
Start: 2023-05-23 — End: 2023-06-26

## 2023-05-23 MED ORDER — PREDNISONE 10 MG PO TABS: ORAL_TABLET | ORAL | 0 refills | Status: DC

## 2023-05-23 MED ORDER — GUAIFENESIN 100 MG/5ML PO LIQD: 5.0000 mL | ORAL | 0 refills | Status: DC | PRN

## 2023-05-23 MED ORDER — BENZONATATE 100 MG PO CAPS: 100.0000 mg | ORAL_CAPSULE | Freq: Three times a day (TID) | ORAL | 0 refills | Status: DC | PRN

## 2023-05-23 NOTE — Discharge Summary (Signed)
Physician Discharge Summary   Patient: Eugene Patel MRN: [id number] DOB: 1950-10-31  Admit date:     05/16/2023  Discharge date: 05/23/23  Discharge Physician: Tirsa Gail. MD    PCP: Karie Schwalbe, MD   Recommendations at discharge:   Prednisone 40 mg daily for 3 days, 30 mg daily for 3 days, 20 mg daily for 3 days, 10 mg daily for 3 days then off Outpatient follow-up with pulmonology, Dr. Jayme Cloud in 2 weeks Ambulatory referral to pulmonology for lung cancer screening  Discharge Diagnoses:    Sepsis (HCC) Pneumococcal pneumonia (HCC)   COPD (chronic obstructive pulmonary disease) with emphysema (HCC) with acute exacerbation   CAP (community acquired pneumonia)   Hypophosphatemia   Hyponatremia     Hospital Course: Patient is a 72 year old male with COPD, former tobacco use, quit 1 year ago presented to ED with sudden onset of shortness of breath around 3 AM on the day of admission, no chest pain.  In ED, was noted to be tripoding, chest x-ray showed hyperinflation with emphysema, groundglass opacities in the left peripheral lung base possible pneumonia.  Patient was placed on empiric IV antibiotics. In ED, temp 97.7 F, BP 90/58, pulse 96, O2 sats 90% on room air, RR 26  Assessment and Plan:   Sepsis (HCC), CAP PNA, dyspnea pneumococcal pneumonia -Patient met Sepsis criteria due to borderline BP, tachycardia, leukocytosis, lactic acidosis, elevated procalcitonin, source likely due to community-acquired pneumonia -CTA negative for PE. -COVID-negative, flu, RSV negative respiratory virus panel negative -Blood cultures negative so far, urine strep antigen positive, patient has completed 7 days of IV Rocephin -Urine Legionella antigen negative -Lactic acid 5.0 on admission, improved to 2.6 -2D echo showed EF of 60 to 65%, no regional WMA received Lasix 40 mg IV x 1 -Patient was followed by pulmonology during hospitalization, was placed on IV Solu-Medrol, Pulmicort,  Rosalyn Gess, Yupelri -Patient has been weaned off of O2 -Outpatient follow-up with Dr. Jayme Cloud in 7 to 10 days      Active Problems:   COPD (chronic obstructive pulmonary disease) with emphysema (HCC) with acute exacerbation - Quit smoking 1 year ago -Continue DuoNebs qid, Breztri.  Esfentrine would come from a specialty pharmacy, patient has not received it yet.  -Continue prednisone with taper, outpatient follow-up with  Hypophosphatemia -Phosphorus 1.0 on admission  -Placed on Neutra-Phos twice daily, phosphorus 3.6 on 12/7   Mild hypovolemic hyponatremia -Received IV fluid hydration Sodium 138 at discharge     Estimated body mass index is 19.73 kg/m as calculated from the following:   Height as of this encounter: 6' (1.829 m).   Weight as of this encounter: 66 kg.     Pain control - [employer] Controlled Substance Reporting System database was reviewed. and patient was instructed, not to drive, operate heavy machinery, perform activities at heights, swimming or participation in water activities or provide baby-sitting services while on Pain, Sleep and Anxiety Medications; until their outpatient Physician has advised to do so again. Also recommended to not to take more than prescribed Pain, Sleep and Anxiety Medications.  Consultants: Pulmonology Procedures performed: None Disposition: Home Diet recommendation:  Discharge Diet Orders (From admission, onward)     Start     Ordered   05/23/23 0000  Diet - low sodium heart healthy        05/23/23 1206           DISCHARGE MEDICATION: Allergies as of 05/23/2023   No Known Allergies  Medication List     TAKE these medications    albuterol 108 (90 Base) MCG/ACT inhaler Commonly known as: VENTOLIN HFA INHALE 2 PUFFS INTO THE LUNGS 3 TIMES DAILY AS NEEDED   albuterol (2.5 MG/3ML) 0.083% nebulizer solution Commonly known as: PROVENTIL TAKE 3 MLS BY NEBULIZATION EVERY 6 HOURSAS NEEDED FOR WHEEZING OR SHORTNESS  OF BREATH   benzonatate 100 MG capsule Commonly known as: Tessalon Perles Take 1 capsule (100 mg total) by mouth 3 (three) times daily as needed for cough.   Breztri Aerosphere 160-9-4.8 MCG/ACT Aero Generic drug: Budeson-Glycopyrrol-Formoterol INHALE TWO PUFFS INTO THE LUNGS IN THE MORNING AND AT BEDTIME   diphenhydramine-acetaminophen 25-500 MG Tabs tablet Commonly known as: TYLENOL PM Take 1 tablet by mouth at bedtime as needed.   guaiFENesin 600 MG 12 hr tablet Commonly known as: MUCINEX Take 1 tablet (600 mg total) by mouth 2 (two) times daily.   guaiFENesin 100 MG/5ML liquid Commonly known as: ROBITUSSIN Take 5 mLs by mouth every 4 (four) hours as needed for cough or to loosen phlegm.   ipratropium-albuterol 0.5-2.5 (3) MG/3ML Soln Commonly known as: DUONEB Take 3 mLs by nebulization 4 (four) times daily.   Ohtuvayre 3 MG/2.5ML Susp Generic drug: Ensifentrine Take 3 mg by nebulization 2 (two) times daily.   predniSONE 10 MG tablet Commonly known as: DELTASONE Prednisone dosing: Take  Prednisone 40mg  (4 tabs) x 3 days, then taper to 30mg  (3 tabs) x 3 days, then 20mg  (2 tabs) x 3days, then 10mg  (1 tab) x 3days, then OFF.   triamcinolone cream 0.1 % Commonly known as: KENALOG APPLY TOPICALLY TWICE DAILY AS NEEDED.        Follow-up Information     Karie Schwalbe, MD. Schedule an appointment as soon as possible for a visit in 2 week(s).   Specialties: Internal Medicine, Pediatrics Why: for hospital follow-up Contact information: [address] [place] [address] [phone]         Salena Saner, MD. Schedule an appointment as soon as possible for a visit in 2 week(s).   Specialty: Pulmonary Disease Why: for hospital follow-up Contact information: [address] [place] [address] [phone]                Discharge Exam: Ceasar Mons Weights   05/17/23 1653  Weight: 66 kg   S: Doing much better, feels good to  home, no fevers or chills, no chest pain, shortness of breath is improving  BP 107/84 (BP Location: Left Arm)   Pulse 99   Temp 97.7 F (36.5 C) (Oral)   Resp 17   Ht 6' (1.829 m)   Wt 66 kg   SpO2 97%   BMI 19.73 kg/m   Physical Exam General: Alert and oriented x 3, NAD Cardiovascular: S1 S2 clear, RRR.  Respiratory: CTAB, no wheezing Gastrointestinal: Soft, nontender, nondistended, NBS Ext: no pedal edema bilaterally Neuro: no new deficits Psych: Normal affect   Condition at discharge: fair  The results of significant diagnostics from this hospitalization (including imaging, microbiology, ancillary and laboratory) are listed below for reference.   Imaging Studies: DG Chest 2 View  Result Date: 05/21/2023 CLINICAL DATA:  Follow up community-acquired pneumonia. EXAM: CHEST - 2 VIEW COMPARISON:  Radiographs 05/16/2023 and 07/18/2022.  CT 05/16/2023. FINDINGS: The heart size and mediastinal contours are stable with aortic atherosclerosis. The lungs remain hyperinflated with stable biapical scarring. The asymmetric interstitial prominence at the left lung base on the recent  studies has slightly progressed in the interval, remaining suspicious for acute superimposed inflammation/infection. There is no consolidation, pleural effusion or pneumothorax. The bones appear unchanged. IMPRESSION: Slight progression of asymmetric interstitial prominence at the left lung base, remaining suspicious for acute superimposed inflammation/infection. No consolidation or pleural effusion. Longer-term radiographic follow up recommended. Electronically Signed   By: Carey Bullocks M.D.   On: 05/21/2023 12:59   ECHOCARDIOGRAM COMPLETE  Result Date: 05/18/2023    ECHOCARDIOGRAM REPORT   Patient Name:   KAEMON SANDEFER Date of Exam: 05/18/2023 Medical Rec #:  [id number]       Height:       72.0 in Accession #:    [id number]      Weight:       145.5 lb Date of Birth:  03-Oct-1950      BSA:          1.861 m  Patient Age:    72 years        BP:           88/55 mmHg Patient Gender: M               HR:           91 bpm. Exam Location:  Inpatient Procedure: 2D Echo, Color Doppler, Cardiac Doppler and Intracardiac            Opacification Agent Indications:    R06.9 DOE  History:        Patient has no prior history of Echocardiogram examinations.                 COPD.  Sonographer:    Irving Burton Senior RDCS Referring Phys: [id number] Lindzie Boxx K Trammell Bowden  Sonographer Comments: Technically difficult due to thin body habitus and COPD IMPRESSIONS  1. Left ventricular ejection fraction, by estimation, is 60 to 65%. The left ventricle has normal function. The left ventricle has no regional wall motion abnormalities. Left ventricular diastolic parameters were normal.  2. Right ventricular systolic function is normal. The right ventricular size is normal. There is normal pulmonary artery systolic pressure. The estimated right ventricular systolic pressure is 26.0 mmHg.  3. The mitral valve was not well visualized. Trivial mitral valve regurgitation. No evidence of mitral stenosis.  4. The aortic valve is normal in structure. Aortic valve regurgitation is not visualized. No aortic stenosis is present.  5. The inferior vena cava is normal in size with greater than 50% respiratory variability, suggesting right atrial pressure of 3 mmHg. FINDINGS  Left Ventricle: Left ventricular ejection fraction, by estimation, is 60 to 65%. The left ventricle has normal function. The left ventricle has no regional wall motion abnormalities. Definity contrast agent was given IV to delineate the left ventricular  endocardial borders. The left ventricular internal cavity size was normal in size. There is no left ventricular hypertrophy. Left ventricular diastolic parameters were normal. Right Ventricle: The right ventricular size is normal. No increase in right ventricular wall thickness. Right ventricular systolic function is normal. There is normal pulmonary artery  systolic pressure. The tricuspid regurgitant velocity is 2.40 m/s, and  with an assumed right atrial pressure of 3 mmHg, the estimated right ventricular systolic pressure is 26.0 mmHg. Left Atrium: Left atrial size was normal in size. Right Atrium: Right atrial size was normal in size. Pericardium: There is no evidence of pericardial effusion. Mitral Valve: The mitral valve was not well visualized. Trivial mitral valve regurgitation. No evidence of mitral valve stenosis. Tricuspid Valve: The tricuspid  valve is normal in structure. Tricuspid valve regurgitation is trivial. No evidence of tricuspid stenosis. Aortic Valve: The aortic valve is normal in structure. Aortic valve regurgitation is not visualized. No aortic stenosis is present. Pulmonic Valve: The pulmonic valve was normal in structure. Pulmonic valve regurgitation is not visualized. No evidence of pulmonic stenosis. Aorta: The aortic root is normal in size and structure. Venous: The inferior vena cava is normal in size with greater than 50% respiratory variability, suggesting right atrial pressure of 3 mmHg. IAS/Shunts: No atrial level shunt detected by color flow Doppler.  LEFT VENTRICLE PLAX 2D LVIDd:         3.30 cm   Diastology LVIDs:         2.30 cm   LV e' medial:    8.70 cm/s LV PW:         0.80 cm   LV E/e' medial:  7.7 LV IVS:        0.80 cm   LV e' lateral:   10.30 cm/s LVOT diam:     2.00 cm   LV E/e' lateral: 6.5 LV SV:         52 LV SV Index:   28 LVOT Area:     3.14 cm  RIGHT VENTRICLE RV S prime:     12.20 cm/s TAPSE (M-mode): 1.8 cm LEFT ATRIUM           Index        RIGHT ATRIUM           Index LA diam:      2.90 cm 1.56 cm/m   RA Area:     11.10 cm LA Vol (A4C): 31.0 ml 16.66 ml/m  RA Volume:   24.00 ml  12.90 ml/m  AORTIC VALVE LVOT Vmax:   75.00 cm/s LVOT Vmean:  54.800 cm/s LVOT VTI:    0.164 m  AORTA Ao Root diam: 2.90 cm MITRAL VALVE               TRICUSPID VALVE MV Area (PHT): 3.10 cm    TR Peak grad:   23.0 mmHg MV Decel Time:  245 msec    TR Vmax:        240.00 cm/s MV E velocity: 66.60 cm/s MV A velocity: 77.60 cm/s  SHUNTS MV E/A ratio:  0.86        Systemic VTI:  0.16 m                            Systemic Diam: 2.00 cm Arvilla Meres MD Electronically signed by Arvilla Meres MD Signature Date/Time: 05/18/2023/11:28:13 AM    Final    CT Angio Chest PE W/Cm &/Or Wo Cm  Result Date: 05/16/2023 CLINICAL DATA:  Pulmonary embolus suspected with high probability. EXAM: CT ANGIOGRAPHY CHEST WITH CONTRAST TECHNIQUE: Multidetector CT imaging of the chest was performed using the standard protocol during bolus administration of intravenous contrast. Multiplanar CT image reconstructions and MIPs were obtained to evaluate the vascular anatomy. RADIATION DOSE REDUCTION: This exam was performed according to the departmental dose-optimization program which includes automated exposure control, adjustment of the mA and/or kV according to patient size and/or use of iterative reconstruction technique. CONTRAST:  75mL OMNIPAQUE IOHEXOL 350 MG/ML SOLN COMPARISON:  Chest radiograph 05/16/2023.  CT chest 05/23/2022 FINDINGS: Cardiovascular: Technically adequate study with good opacification of the central and segmental pulmonary arteries and mild motion artifact. No focal filling defects are seen. No evidence  of significant pulmonary embolus. Normal heart size. No pericardial effusions. Normal caliber thoracic aorta. Minimal calcification in the aorta and coronary arteries. Mediastinum/Nodes: No enlarged mediastinal, hilar, or axillary lymph nodes. Thyroid gland, trachea, and esophagus demonstrate no significant findings. Lungs/Pleura: Severe emphysematous changes in the lungs. Nodular scarring in the right apex is unchanged. New interstitial infiltrates with ground-glass in the left lung base could represent superimposed pneumonia or possibly developing interstitial lung disease. Calcified granulomas. No pleural effusions. No pneumothorax. Upper  Abdomen: Multiple cysts in the liver without change. No imaging follow-up is indicated. Surgical absence of the gallbladder. No acute abnormalities are suggested. Musculoskeletal: Degenerative changes in the spine. No acute bony abnormalities. Review of the MIP images confirms the above findings. IMPRESSION: 1. No evidence of significant pulmonary embolus. 2. Severe emphysematous changes throughout the lungs. Chronic scarring in the right apex. 3. New interstitial and alveolar infiltrates in the left lung base probably representing pneumonia. Less likely developing interstitial lung disease with acute alveolitis. Electronically Signed   By: Burman Nieves M.D.   On: 05/16/2023 21:35   DG Chest 2 View  Result Date: 05/16/2023 CLINICAL DATA:  Possible sepsis cough and chest pain EXAM: CHEST - 2 VIEW COMPARISON:  07/18/2022 FINDINGS: Hyperinflation with emphysema ground-glass opacity at the left peripheral lung base. No pleural effusion. Normal cardiomediastinal silhouette. No pneumothorax IMPRESSION: Hyperinflation with emphysema. Ground-glass opacity at the left peripheral lung base, possible pneumonia. Electronically Signed   By: Jasmine Pang M.D.   On: 05/16/2023 20:21    Microbiology: Results for orders placed or performed during the hospital encounter of 05/16/23  Blood Culture (routine x 2)     Status: None   Collection Time: 05/16/23  6:33 PM   Specimen: BLOOD RIGHT FOREARM  Result Value Ref Range Status   Specimen Description BLOOD RIGHT FOREARM  Final   Special Requests   Final    BOTTLES DRAWN AEROBIC AND ANAEROBIC Blood Culture results may not be optimal due to an inadequate volume of blood received in culture bottles   Culture   Final    NO GROWTH 5 DAYS Performed at [facility], [address], [place], [place] [address]    Report Status 05/21/2023 FINAL  Final  Resp panel by RT-PCR (RSV, Flu A&B, Covid) Anterior Nasal Swab     Status: None   Collection Time: 05/16/23  6:42  PM   Specimen: Anterior Nasal Swab  Result Value Ref Range Status   SARS Coronavirus 2 by RT PCR NEGATIVE NEGATIVE Final   Influenza A by PCR NEGATIVE NEGATIVE Final   Influenza B by PCR NEGATIVE NEGATIVE Final    Comment: (NOTE) The Xpert Xpress SARS-CoV-2/FLU/RSV plus assay is intended as an aid in the diagnosis of influenza from Nasopharyngeal swab specimens and should not be used as a sole basis for treatment. Nasal washings and aspirates are unacceptable for Xpert Xpress SARS-CoV-2/FLU/RSV testing.  Fact Sheet for Patients: BloggerCourse.com  Fact Sheet for Healthcare Providers: SeriousBroker.it  This test is not yet approved or cleared by the [place] FDA and has been authorized for detection and/or diagnosis of SARS-CoV-2 by FDA under an Emergency Use Authorization (EUA). This EUA will remain in effect (meaning this test can be used) for the duration of the COVID-19 declaration under Section 564(b)(1) of the Act, 21 U.S.C. section 360bbb-3(b)(1), unless the authorization is terminated or revoked.     Resp Syncytial Virus by PCR NEGATIVE NEGATIVE Final    Comment: (NOTE) Fact Sheet for  Patients: BloggerCourse.com  Fact Sheet for Healthcare Providers: SeriousBroker.it  This test is not yet approved or cleared by the [place] FDA and has been authorized for detection and/or diagnosis of SARS-CoV-2 by FDA under an Emergency Use Authorization (EUA). This EUA will remain in effect (meaning this test can be used) for the duration of the COVID-19 declaration under Section 564(b)(1) of the Act, 21 U.S.C. section 360bbb-3(b)(1), unless the authorization is terminated or revoked.  Performed at [facility], [address], [place], [place] [address]   Blood Culture (routine x 2)     Status: None   Collection Time: 05/16/23  6:42 PM   Specimen: BLOOD  Result  Value Ref Range Status   Specimen Description BLOOD RIGHT ANTECUBITAL  Final   Special Requests   Final    BOTTLES DRAWN AEROBIC AND ANAEROBIC Blood Culture adequate volume   Culture   Final    NO GROWTH 5 DAYS Performed at [facility], [address], [place], [place] [address]    Report Status 05/21/2023 FINAL  Final  Respiratory (~20 pathogens) panel by PCR     Status: None   Collection Time: 05/17/23  6:38 AM   Specimen: Nasopharyngeal Swab; Respiratory  Result Value Ref Range Status   Adenovirus NOT DETECTED NOT DETECTED Final   Coronavirus 229E NOT DETECTED NOT DETECTED Final    Comment: (NOTE) The Coronavirus on the Respiratory Panel, DOES NOT test for the novel  Coronavirus (2019 nCoV)    Coronavirus HKU1 NOT DETECTED NOT DETECTED Final   Coronavirus NL63 NOT DETECTED NOT DETECTED Final   Coronavirus OC43 NOT DETECTED NOT DETECTED Final   Metapneumovirus NOT DETECTED NOT DETECTED Final   Rhinovirus / Enterovirus NOT DETECTED NOT DETECTED Final   Influenza A NOT DETECTED NOT DETECTED Final   Influenza B NOT DETECTED NOT DETECTED Final   Parainfluenza Virus 1 NOT DETECTED NOT DETECTED Final   Parainfluenza Virus 2 NOT DETECTED NOT DETECTED Final   Parainfluenza Virus 3 NOT DETECTED NOT DETECTED Final   Parainfluenza Virus 4 NOT DETECTED NOT DETECTED Final   Respiratory Syncytial Virus NOT DETECTED NOT DETECTED Final   Bordetella pertussis NOT DETECTED NOT DETECTED Final   Bordetella Parapertussis NOT DETECTED NOT DETECTED Final   Chlamydophila pneumoniae NOT DETECTED NOT DETECTED Final   Mycoplasma pneumoniae NOT DETECTED NOT DETECTED Final    Comment: Performed at [facility], [address], [place], [place] [address]    Labs: CBC: Recent Labs  Lab 05/16/23 1833 05/16/23 1843 05/17/23 0358 05/18/23 0329 05/19/23 0613 05/20/23 0310 05/22/23 0350  WBC 24.9*  --  19.6* 18.8* 13.2* 12.1* 15.1*  NEUTROABS 21.9*  --   --   --   --   --   --   HGB  16.2   < > 14.3 12.8* 12.3* 13.0 13.8  HCT 47.2   < > 42.3 38.6* 37.2* 38.8* 41.7  MCV 91.8  --  93.8 92.8 95.1 94.9 95.0  PLT 289  --  248 255 247 271 279   < > = values in this interval not displayed.   Basic Metabolic Panel: Recent Labs  Lab 05/17/23 0358 05/18/23 0329 05/19/23 0613 05/20/23 0310 05/22/23 0350  NA 135 138 139 139 138  K 3.7 4.2 4.1 3.9 3.7  CL 106 106 107 106 105  CO2 20* 23 25 25 24   GLUCOSE 157* 128* 98 102* 92  BUN 11 17 15 15 18   CREATININE  1.02 1.05 1.14 1.07 1.00  CALCIUM 8.3* 8.5* 8.2* 8.2* 8.1*  MG 2.4  --   --   --   --   PHOS 1.0* 3.8 3.7 3.6  --    Liver Function Tests: Recent Labs  Lab 05/16/23 1833  AST 26  ALT 18  ALKPHOS 68  BILITOT 2.7*  PROT 6.6  ALBUMIN 3.6   CBG: No results for input(s): "GLUCAP" in the last 168 hours.  Discharge time spent: greater than 30 minutes.  Signed: Thad Ranger, MD [facility] 05/23/2023

## 2023-05-23 NOTE — Progress Notes (Signed)
Discussed with primary pulmonologist Dr. Jayme Cloud -- ok with discharge on Breztri and duonebs PRN. She will move the follow up appointment up (planning for next week) and she will follow up with the pharmacy for Ensenfentrine.   Steffanie Dunn, DO 05/23/23 3:56 PM [facility]  For contact information, see Amion. If no response to pager, please call PCCM consult pager. After hours, 7PM- 7AM, please call Elink.

## 2023-05-23 NOTE — Progress Notes (Signed)
DISCHARGE NOTE HOME DETRAVION RONCHETTI to be discharged Home per MD order. Discussed prescriptions and follow up appointments with the patient. Prescriptions given to patient; medication list explained in detail. Patient verbalized understanding.  Skin clean, dry and intact without evidence of skin break down, no evidence of skin tears noted. IV catheter discontinued intact. Site without signs and symptoms of complications. Dressing and pressure applied. Pt denies pain at the site currently. No complaints noted.  Patient free of lines, drains, and wounds.   An After Visit Summary (AVS) was printed and given to the patient. Patient escorted via wheelchair, and discharged home via private auto.  Velia Meyer, RN

## 2023-05-23 NOTE — Telephone Encounter (Signed)
-----   Message from Sarina Ser sent at 05/23/2023  3:37 PM EST ----- Regarding: RE: Ensifentrine start Yes, he should continue  Breztri.  Okay with making the DuoNeb as needed.  Will check with the specialty pharmacy has been a little bit of an issue I will have the pharmacy team follow-up on this.  He has terrible COPD. Will follow up to see what is going on.He is not due to see until Feb. So will move that appt up to next week.  Thanks!   Diamond Nickel ----- Message ----- From: Steffanie Dunn, DO Sent: 05/23/2023  12:07 PM EST To: Salena Saner, MD Subject: Ensifentrine start                             Dawayne Cirri,  Do you have any info on how long patients should expect the Ensifentrine to be sent to them? My understanding is this comes from specialty pharmacies? Looks like he last saw you on 11/18 and reported he doesn't have it yet.   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  Thanks in advance for your help!  Zoe Lan

## 2023-05-23 NOTE — TOC Transition Note (Signed)
Transition of Care [facility]) - CM/SW Discharge Note   Patient Details  Name: Eugene Patel MRN: [id number] Date of Birth: 08-24-1950  Transition of Care [facility]) CM/SW Contact:  Tom-Johnson, Hershal Coria, RN Phone Number: 05/23/2023, 12:37 PM   Clinical Narrative:     Patient is scheduled for discharge today.  Readmission Risk Assessment done. Outpatient f/u, hospital f/u and discharge instructions on AVS. No TOC needs or recommendations noted. Wife, Olegario Messier to transport at discharge.  No further TOC needs noted.           Final next level of care: Home/Self Care Barriers to Discharge: Barriers Resolved   Patient Goals and CMS Choice CMS Medicare.gov Compare Post Acute Care list provided to:: Patient Choice offered to / list presented to : NA  Discharge Placement                  Patient to be transferred to facility by: Wife Name of family member notified: [facility]    Discharge Plan and Services Additional resources added to the After Visit Summary for                  DME Arranged: N/A DME Agency: NA       HH Arranged: NA HH Agency: NA        Social Determinants of Health (SDOH) Interventions SDOH Screenings   Food Insecurity: No Food Insecurity (05/17/2023)  Housing: Low Risk  (05/17/2023)  Transportation Needs: No Transportation Needs (05/17/2023)  Utilities: Not At Risk (05/17/2023)  Depression (PHQ2-9): Low Risk  (06/20/2022)  Tobacco Use: Medium Risk (05/18/2023)     Readmission Risk Interventions    05/23/2023   12:36 PM 05/18/2023   12:21 PM  Readmission Risk Prevention Plan  Post Dischage Appt Complete   Medication Screening Complete   Transportation Screening Complete Complete  PCP or Specialist Appt within 5-7 Days  Complete  Home Care Screening  Complete  Medication Review (RN CM)  Referral to Pharmacy

## 2023-05-23 NOTE — Progress Notes (Signed)
Occupational Therapy Treatment Patient Details Name: Eugene Patel MRN: [id number] DOB: 1951-03-16 Today's Date: 05/23/2023   History of present illness 72 y.o. male presents to the ED 12/3 with sudden onset shortness of breath, dx community-acquired pneumonia. PMHx: COPD, former tobacco user, quit 1 year ago.   OT comments  Patient demonstrating good progress with OT treatment, meeting goals for OOB ADLs for 8+ minutes without seated rest break. Reviewed energy conservation strategies with handout with patient continues to require cues to recall. Discharge recommendations continue to be appropriate. Acute OT to continue to follow for energy conservation with self care.       If plan is discharge home, recommend the following:  Other (comment) (on pt request)   Equipment Recommendations  None recommended by OT    Recommendations for Other Services      Precautions / Restrictions Precautions Precautions: Fall Precaution Comments: monitor O2 Restrictions Weight Bearing Restrictions: No       Mobility Bed Mobility Overal bed mobility: Modified Independent             General bed mobility comments: seated on EOB at beginning and end of session    Transfers Overall transfer level: Modified independent Equipment used: None               General transfer comment: no physical assistance with sit to stands or transfers     Balance Overall balance assessment: Needs assistance Sitting-balance support: No upper extremity supported, Feet supported Sitting balance-Leahy Scale: Good Sitting balance - Comments: seated on EOB upon entry   Standing balance support: No upper extremity supported, During functional activity Standing balance-Leahy Scale: Fair                             ADL either performed or assessed with clinical judgement   ADL Overall ADL's : Modified independent                                       General ADL  Comments: stood at sink for self care tasks for >8 minutes    Extremity/Trunk Assessment              Vision       Perception     Praxis      Cognition Arousal: Alert Behavior During Therapy: WFL for tasks assessed/performed Overall Cognitive Status: Within Functional Limits for tasks assessed                                          Exercises      Shoulder Instructions       General Comments 95-98% on RA reviewed energy conservation handout    Pertinent Vitals/ Pain       Pain Assessment Pain Assessment: No/denies pain Pain Intervention(s): Monitored during session  Home Living                                          Prior Functioning/Environment              Frequency  Min 1X/week        Progress Toward Goals  OT Goals(current goals can  now be found in the care plan section)  Progress towards OT goals: Progressing toward goals  Acute Rehab OT Goals Patient Stated Goal: go home OT Goal Formulation: With patient Time For Goal Achievement: 05/31/23 Potential to Achieve Goals: Good ADL Goals Additional ADL Goal #1: pt will identify and implement 2+ energy conservation techniques with mod i Additional ADL Goal #2: Pt will perform 8+ mins OOB ADL with mod I and no seated rest breaks.  Plan      Co-evaluation                 AM-PAC OT "6 Clicks" Daily Activity     Outcome Measure   Help from another person eating meals?: None Help from another person taking care of personal grooming?: None Help from another person toileting, which includes using toliet, bedpan, or urinal?: None Help from another person bathing (including washing, rinsing, drying)?: None Help from another person to put on and taking off regular upper body clothing?: None Help from another person to put on and taking off regular lower body clothing?: None 6 Click Score: 24    End of Session    OT Visit Diagnosis: Unsteadiness on  feet (R26.81);Other (comment)   Activity Tolerance Patient tolerated treatment well   Patient Left in bed;with call bell/phone within reach;Other (comment) (seated on EOB)   Nurse Communication Mobility status        Time: 1610-9604 OT Time Calculation (min): 18 min  Charges: OT General Charges $OT Visit: 1 Visit OT Treatments $Self Care/Home Management : 8-22 mins  Alfonse Flavors, OTA Acute Rehabilitation Services  Office [phone]   Dewain Penning 05/23/2023, 9:16 AM

## 2023-05-23 NOTE — Telephone Encounter (Signed)
Can you check on the [facility] for this patient?

## 2023-05-24 ENCOUNTER — Telehealth: Payer: Self-pay

## 2023-05-24 NOTE — Transitions of Care (Post Inpatient/ED Visit) (Signed)
05/24/2023  Name: Eugene Patel MRN: [id number] DOB: 01-Sep-1950  Today's TOC FU Call Status: Today's TOC FU Call Status:: Successful TOC FU Call Completed TOC FU Call Complete Date: 05/24/23 Patient's Name and Date of Birth confirmed.  Transition Care Management Follow-up Telephone Call Date of Discharge: 05/23/23 Discharge Facility: [facility]) Type of Discharge: Inpatient Admission Primary Inpatient Discharge Diagnosis:: COPD exacerbation How have you been since you were released from the hospital?: Better Any questions or concerns?: No  Items Reviewed: Did you receive and understand the discharge instructions provided?: Yes Medications obtained,verified, and reconciled?: Yes (Medications Reviewed) Any new allergies since your discharge?: No Dietary orders reviewed?: Yes Type of Diet Ordered:: Low Sodium, Heart Healthy Do you have support at home?: Yes People in Home: spouse Name of Support/Comfort Primary Source: Olegario Messier, spouse  Medications Reviewed Today: Medications Reviewed Today     Reviewed by Redge Gainer, RN (Case Manager) on 05/24/23 at [id number]  Med List Status: <None>   Medication Order Taking? Sig Documenting Provider Last Dose Status Informant  albuterol (PROVENTIL) (2.5 MG/3ML) 0.083% nebulizer solution [id number] No TAKE 3 MLS BY NEBULIZATION EVERY 6 HOURSAS NEEDED FOR WHEEZING OR SHORTNESS OF Georgette Dover, MD 05/16/2023 Active Self, Pharmacy Records  albuterol (VENTOLIN HFA) 108 (90 Base) MCG/ACT inhaler [id number] No INHALE 2 PUFFS INTO THE LUNGS 3 TIMES DAILY AS NEEDED Karie Schwalbe, MD 05/16/2023 Active Self, Pharmacy Records  benzonatate (TESSALON PERLES) 100 MG capsule [id number]  Take 1 capsule (100 mg total) by mouth 3 (three) times daily as needed for cough. Cathren Harsh, MD  Active   BREZTRI AEROSPHERE 160-9-4.8 MCG/ACT AERO [id number] No INHALE TWO PUFFS INTO THE LUNGS IN THE MORNING AND AT BEDTIME Salena Saner, MD  05/16/2023 Active Self, Pharmacy Records  diphenhydramine-acetaminophen (TYLENOL PM) 25-500 MG TABS tablet [id number] No Take 1 tablet by mouth at bedtime as needed. [provider] Past Week Active Self, Pharmacy Records  Ensifentrine [facility] 3 MG/2.5ML SUSP [id number]  Take 3 mg by nebulization 2 (two) times daily. Salena Saner, MD  Active Self, Pharmacy Records           Med Note Nedra Hai, NICOLE   Tue May 16, 2023 10:14 PM) Patient hasn't received this medication.  guaiFENesin (MUCINEX) 600 MG 12 hr tablet [id number]  Take 1 tablet (600 mg total) by mouth 2 (two) times daily. Rai, Delene Ruffini, MD  Active   guaiFENesin (ROBITUSSIN) 100 MG/5ML liquid [id number]  Take 5 mLs by mouth every 4 (four) hours as needed for cough or to loosen phlegm. Rai, Delene Ruffini, MD  Active   ipratropium-albuterol (DUONEB) 0.5-2.5 (3) MG/3ML SOLN [id number] No Take 3 mLs by nebulization 4 (four) times daily.  Patient taking differently: Take 3 mLs by nebulization every 4 (four) hours as needed. As needed   Salena Saner, MD Lajoyce Lauber Self, Pharmacy Records  predniSONE (DELTASONE) 10 MG tablet [id number]  Prednisone dosing: Take  Prednisone 40mg  (4 tabs) x 3 days, then taper to 30mg  (3 tabs) x 3 days, then 20mg  (2 tabs) x 3days, then 10mg  (1 tab) x 3days, then OFF. Rai, Delene Ruffini, MD  Active   triamcinolone cream (KENALOG) 0.1 % [id number] No APPLY TOPICALLY TWICE DAILY AS NEEDED. Karie Schwalbe, MD UNK Active Self, Pharmacy Records            Home Care and Equipment/Supplies: Were Home Health Services Ordered?: NA Any new equipment or medical supplies ordered?: NA  Functional Questionnaire: Do  you need assistance with bathing/showering or dressing?: No Do you need assistance with meal preparation?: No Do you need assistance with eating?: No Do you have difficulty maintaining continence: No Do you need assistance with getting out of bed/getting out of a chair/moving?: No Do you have  difficulty managing or taking your medications?: No  Follow up appointments reviewed: PCP Follow-up appointment confirmed?: No MD Provider Line Number:[phone] Given:  (The patient doesn't think he needs to see the PCP right now) Specialist Hospital Follow-up appointment confirmed?: No Reason Specialist Follow-Up Not Confirmed: Patient has Specialist Provider Number and will Call for Appointment Do you need transportation to your follow-up appointment?: No Do you understand care options if your condition(s) worsen?: Yes-patient verbalized understanding  SDOH Interventions Today    Flowsheet Row Most Recent Value  SDOH Interventions   Food Insecurity Interventions Intervention Not Indicated  Housing Interventions Intervention Not Indicated  Transportation Interventions Intervention Not Indicated  Utilities Interventions Intervention Not Indicated      TOC Outreach completed to the patient today. He states he is feeling much better and he is anxious to get back to work. 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 He states he is waiting for a new pulmonary drug to be approved. He is not on oxygen and he is following his Prednisone taper. He has used his nebulizer a couple of time since being home but he does not use it four times a day as recommended on the Medication list. He is independent with ADL's and is ready to get back to work as he doesn't like sitting around.   The patient has been provided with contact information for the care management team and has been advised to call with any health-related questions or concerns. The patient verbalized understanding with current POC. The patient is directed to their insurance card regarding availability of benefits coverage  Deidre Ala, RN RN Care Manager [facility] [phone]

## 2023-05-25 LAB — LEGIONELLA PNEUMOPHILA SEROGP 1 UR AG: L. pneumophila Serogp 1 Ur Ag: NEGATIVE

## 2023-05-25 NOTE — Telephone Encounter (Signed)
[employer] calling to check on the PA for [employer] my Meds Key ID: [id number]

## 2023-05-25 NOTE — Telephone Encounter (Signed)
Specialty PA

## 2023-05-29 ENCOUNTER — Ambulatory Visit: Payer: Managed Care, Other (non HMO) | Admitting: Pulmonary Disease

## 2023-05-29 ENCOUNTER — Encounter: Payer: Self-pay | Admitting: Pulmonary Disease

## 2023-05-29 VITALS — BP 118/72 | HR 105 | Temp 97.3°F | Ht 72.0 in | Wt 153.6 lb

## 2023-05-29 DIAGNOSIS — J449 Chronic obstructive pulmonary disease, unspecified: Secondary | ICD-10-CM

## 2023-05-29 DIAGNOSIS — J13 Pneumonia due to Streptococcus pneumoniae: Secondary | ICD-10-CM | POA: Diagnosis not present

## 2023-05-29 DIAGNOSIS — J441 Chronic obstructive pulmonary disease with (acute) exacerbation: Secondary | ICD-10-CM | POA: Diagnosis not present

## 2023-05-29 NOTE — Patient Instructions (Signed)
VISIT SUMMARY:  During today's visit, we discussed your slow recovery from a recent COPD exacerbation, which was complicated by two COVID-19 infections and a diagnosis of pneumococcal pneumonia. We reviewed your current medications and made some adjustments to help improve your symptoms and overall recovery.  YOUR PLAN:  -CHRONIC OBSTRUCTIVE PULMONARY DISEASE (COPD) EXACERBATION: COPD is a chronic lung condition that makes it hard to breathe. You are experiencing a slow recovery from a recent worsening of your COPD, which was made worse by pneumococcal pneumonia. We recommend you complete your prednisone taper, continue taking plain Mucinex, Breztri, and albuterol nebulizer treatments three times a day. We will also start you on Ohtuvayre to help clear your airways more effectively. Please stay well hydrated to help with mucus clearance.  -PNEUMOCOCCAL PNEUMONIA: Pneumococcal pneumonia is a bacterial infection in the lungs. You were recently hospitalized for this condition, which worsened your COPD. We will monitor your symptoms closely to detect any complications early. Please call us if you notice any new issues. You have a follow-up appointment scheduled for August 07, 2023.  We will give you the new pneumococcal vaccine at that time.  -GENERAL HEALTH MAINTENANCE: Staying well hydrated is important for your recovery and helps with mucus clearance. Please make sure to drink plenty of fluids.  INSTRUCTIONS:  Please complete your prednisone taper as directed. Continue taking plain Mucinex, Breztri, and albuterol nebulizer treatments three times a day. Start taking Ohtuvayre as prescribed. Stay well hydrated and monitor your symptoms closely. Call us if you notice any new issues. Your follow-up appointment is scheduled for August 07, 2023.

## 2023-05-29 NOTE — Progress Notes (Signed)
Subjective:    Patient ID: Eugene Patel, male    DOB: 28-Nov-1950, 72 y.o.   MRN: [id number]  Patient Care Team: Karie Schwalbe, MD as PCP - General (Pediatrics)  Chief Complaint  Patient presents with   Follow-up    DOE.No wheezing or cough.    BACKGROUND/INTERVAL:Patient is a 72 year old former smoker (23 PY) who presents for follow-up on the issue of dyspnea on the basis of severe COPD. I last saw him on 01 May 2023, at that time we prescribed Ohtuvayre.  In the interim he had an admission to [facility] due to pneumococcal pneumonia with severe sepsis.  Was admitted on 16 May 2023 and was discharged home 23 May 2023.  He presents today as a posthospital visit.  HPI Discussed the use of AI scribe software for clinical note transcription with the patient, who gave verbal consent to proceed.  History of Present Illness   The patient, with a history of COPD, reports a slow recovery following a recent exacerbation of his condition.  He was diagnosed with pneumococcal pneumonia and sepsis the same. Despite his condition, he has not been able to cough effectively to clear his airways. He is currently on Mucinex, which he believes is the plain version, and Breztri, which he uses in the morning and before bed. He is also on a tapering course of prednisone, with one more day of three tablets, followed by three days of two tablets, and three days of one tablet remaining. He has been using a nebulizer with albuterol three times a day, which seems to provide some relief.  Since discharge he has not had any fevers, chills or sweats.  No cough or sputum production.  No increased shortness of breath.  He notes that his dyspnea is back to baseline.  He is still awaiting Ohtuvayre.    Review of Systems A 10 point review of systems was performed and it is as noted above otherwise negative.   Patient Active Problem List   Diagnosis Date Noted   Hypophosphatemia 05/19/2023    Hyponatremia 05/19/2023   Pneumococcal pneumonia (HCC) 05/19/2023   Sepsis (HCC) 05/17/2023   CAP (community acquired pneumonia) 05/16/2023   Advance directive discussed with patient 06/03/2019   Tinea cruris 01/23/2017   COPD (chronic obstructive pulmonary disease) with emphysema (HCC) 02/09/2015   Routine general medical examination at a health care facility 12/19/2012   Psoriasis 12/19/2012    Social History   Tobacco Use   Smoking status: Former    Current packs/day: 0.00    Average packs/day: 1.5 packs/day for 52.0 years (78.0 ttl pk-yrs)    Types: Cigarettes    Start date: 07/14/1968    Quit date: 07/14/2020    Years since quitting: 2.8    Passive exposure: Current   Smokeless tobacco: Never  Substance Use Topics   Alcohol use: No    Alcohol/week: 0.0 standard drinks of alcohol    Comment: Exposed to 2nd hand smoke    No Known Allergies  Current Meds  Medication Sig   albuterol (PROVENTIL) (2.5 MG/3ML) 0.083% nebulizer solution TAKE 3 MLS BY NEBULIZATION EVERY 6 HOURSAS NEEDED FOR WHEEZING OR SHORTNESS OF BREATH   albuterol (VENTOLIN HFA) 108 (90 Base) MCG/ACT inhaler INHALE 2 PUFFS INTO THE LUNGS 3 TIMES DAILY AS NEEDED   benzonatate (TESSALON PERLES) 100 MG capsule Take 1 capsule (100 mg total) by mouth 3 (three) times daily as needed for cough.   BREZTRI AEROSPHERE 160-9-4.8 MCG/ACT AERO  INHALE TWO PUFFS INTO THE LUNGS IN THE MORNING AND AT BEDTIME   diphenhydramine-acetaminophen (TYLENOL PM) 25-500 MG TABS tablet Take 1 tablet by mouth at bedtime as needed.   guaiFENesin (MUCINEX) 600 MG 12 hr tablet Take 1 tablet (600 mg total) by mouth 2 (two) times daily.   guaiFENesin (ROBITUSSIN) 100 MG/5ML liquid Take 5 mLs by mouth every 4 (four) hours as needed for cough or to loosen phlegm.   predniSONE (DELTASONE) 10 MG tablet Prednisone dosing: Take  Prednisone 40mg  (4 tabs) x 3 days, then taper to 30mg  (3 tabs) x 3 days, then 20mg  (2 tabs) x 3days, then 10mg  (1 tab) x  3days, then OFF.   triamcinolone cream (KENALOG) 0.1 % APPLY TOPICALLY TWICE DAILY AS NEEDED.    Immunization History  Administered Date(s) Administered   Fluad Quad(high Dose 65+) 06/03/2019, 06/08/2020, 06/21/2021   Fluad Trivalent(High Dose 65+) 05/01/2023   Influenza,inj,Quad PF,6+ Mos 02/09/2015   PFIZER(Purple Top)SARS-COV-2 Vaccination 08/12/2019, 09/12/2019   Pneumococcal Conjugate-13 02/09/2015   Pneumococcal Polysaccharide-23 01/18/2016   Tdap 12/19/2012      Objective:     BP 118/72 (BP Location: Right Arm, Cuff Size: Normal)   Pulse (!) 105   Temp (!) 97.3 F (36.3 C)   Ht 6' (1.829 m)   Wt 153 lb 9.6 oz (69.7 kg)   SpO2 96%   BMI 20.83 kg/m   SpO2: 96 % O2 Device: None (Room air)  GENERAL: Thin well-developed gentleman in no respiratory distress.  No use of accessories noted.  No conversational dyspnea.  Hoarse voice.  Occasional "grunting". HEAD: Normocephalic, atraumatic.  EYES: Pupils equal, round, reactive to light.  No scleral icterus.  MOUTH: Wears dentures uppers, lowers.  Oral mucosa moist, no thrush. NECK: Supple. No thyromegaly. Trachea midline. No JVD.  No adenopathy. PULMONARY: Distant breath sounds bilaterally.  Poor air movement.  Coarse,otherwise, no adventitious sounds.Positive Hoover's sign. CARDIOVASCULAR: S1 and S2. Regular rate and rhythm.  No rubs, murmurs or gallops heard. ABDOMEN: Scaphoid, otherwise benign. MUSCULOSKELETAL: No joint deformity, no clubbing, no edema.  NEUROLOGIC: No overt focal deficit, no gait disturbance, speech is fluent. SKIN: Intact,warm,dry. PSYCH: Mood and behavior normal.       Assessment & Plan:     ICD-10-CM   1. Stage 4 very severe COPD by GOLD classification (HCC)  J44.9     2. COPD with acute exacerbation (HCC)  J44.1     3. Pneumonia of left lower lobe due to Streptococcus pneumoniae [facility])  J13      Discussion:    Chronic Obstructive Pulmonary Disease (COPD) Exacerbation Slow recovery from  recent COPD exacerbation, complicated by two COVID-19 infections. Reports difficulty coughing and clearing mucus. Currently on plain Mucinex, Breztri, albuterol nebulizer treatments, and tapering prednisone. Informed consent provided for Otuvera to improve mucus clearance and ease coughing. - Complete prednisone taper - Continue plain Mucinex - Continue Breztri - Continue albuterol nebulizer treatments TID - Initiate Ohtuvayre, - Encourage hydration  Pneumococcal Pneumonia Recently hospitalized for pneumococcal pneumonia, exacerbating COPD. Discharged last Tuesday after a week-long stay. Informed consent provided for symptom monitoring to detect complications early. - Monitor for worsening symptoms - Follow-up appointment on August 07, 2023 - Advise to call if new issues arise  General Health Maintenance Advised to stay well hydrated for mucus clearance and overall recovery. - Encourage fluid intake - Prevnar 20 on return visit.  Follow-up - Follow-up appointment on August 07, 2023 - Call if new issues arise.  Advised if symptoms do not improve or worsen, to please contact office for sooner follow up or seek emergency care.    I spent 30 minutes of dedicated to the care of this patient on the date of this encounter to include pre-visit review of records, face-to-face time with the patient discussing conditions above, post visit ordering of testing, clinical documentation with the electronic health record, making appropriate referrals as documented, and communicating necessary findings to members of the patients care team.     C. Danice Goltz, MD Advanced Bronchoscopy PCCM [facility]    *This note was generated using voice recognition software/Dragon and/or AI transcription program.  Despite best efforts to proofread, errors can occur which can change the meaning. Any transcriptional errors that result from this process are unintentional and may not be  fully corrected at the time of dictation.

## 2023-05-30 NOTE — Telephone Encounter (Signed)
Patient was seen in the office yesterday. He still has not received his Ohtuvayre. He said someone did reach out to him but he was in the hospital. They have not reached back out.   Please advise.

## 2023-06-06 NOTE — Telephone Encounter (Signed)
Received notification from [employer] regarding a prior authorization for [facility]. Authorization has been APPROVED from 05/07/23 to 06/05/24.   Authorization # [id number]

## 2023-06-06 NOTE — Telephone Encounter (Signed)
PA key below expired. Renewed PA for Ohtuvyare  Key: [id number]

## 2023-06-19 DIAGNOSIS — H524 Presbyopia: Secondary | ICD-10-CM | POA: Diagnosis not present

## 2023-06-26 ENCOUNTER — Encounter: Payer: Self-pay | Admitting: Internal Medicine

## 2023-06-26 ENCOUNTER — Ambulatory Visit (INDEPENDENT_AMBULATORY_CARE_PROVIDER_SITE_OTHER): Payer: Managed Care, Other (non HMO) | Admitting: Internal Medicine

## 2023-06-26 VITALS — BP 102/64 | HR 100 | Temp 97.6°F | Ht 72.0 in | Wt 146.0 lb

## 2023-06-26 DIAGNOSIS — Z0000 Encounter for general adult medical examination without abnormal findings: Secondary | ICD-10-CM | POA: Diagnosis not present

## 2023-06-26 DIAGNOSIS — Z1211 Encounter for screening for malignant neoplasm of colon: Secondary | ICD-10-CM | POA: Diagnosis not present

## 2023-06-26 DIAGNOSIS — J439 Emphysema, unspecified: Secondary | ICD-10-CM | POA: Diagnosis not present

## 2023-06-26 DIAGNOSIS — L409 Psoriasis, unspecified: Secondary | ICD-10-CM

## 2023-06-26 NOTE — Assessment & Plan Note (Signed)
 Healthy again Hard to exercise due to SOB Got flu vaccine---Dr Jayme Cloud is giving him another one soon (RSV?) Prefers no other vaccines (discussed at least getting Td with any injury)

## 2023-06-26 NOTE — Assessment & Plan Note (Signed)
 Recent hospitalization for exacerbation and pneumonia Better now On the albuterol nebs, breztri

## 2023-06-26 NOTE — Progress Notes (Signed)
 Subjective:    Patient ID: Eugene Patel, male    DOB: 29-Oct-1950, 73 y.o.   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 Does use albuterol  regularly  Psoriasis is quiet Hasn't needed the cream  Current Outpatient Medications on File Prior to Visit  Medication Sig Dispense Refill   albuterol  (PROVENTIL ) (2.5 MG/3ML) 0.083% nebulizer solution TAKE 3 MLS BY NEBULIZATION EVERY 6 HOURSAS NEEDED FOR WHEEZING OR SHORTNESS OF BREATH 360 mL 6   albuterol  (VENTOLIN  HFA) 108 (90 Base) MCG/ACT inhaler INHALE 2 PUFFS INTO THE LUNGS 3 TIMES DAILY AS NEEDED 8.5 g 2   BREZTRI  AEROSPHERE 160-9-4.8 MCG/ACT AERO INHALE TWO PUFFS INTO THE LUNGS IN THE MORNING AND AT BEDTIME 10.7 each 11   diphenhydramine-acetaminophen  (TYLENOL  PM) 25-500 MG TABS tablet Take 1 tablet by mouth at bedtime as needed.     triamcinolone  cream (KENALOG ) 0.1 % APPLY TOPICALLY TWICE DAILY AS NEEDED. 45 g 3   No current facility-administered medications on file prior to visit.    No Known Allergies  Past Medical History:  Diagnosis Date   Emphysema lung (HCC)     Past Surgical History:  Procedure Laterality Date   CHOLECYSTECTOMY  2010    Family History  Problem Relation Age of Onset   Diabetes Father    Diabetes Other    Cancer Neg Hx     Heart disease Neg Hx    Hypertension Neg Hx     Social History   Socioeconomic History   Marital status: Married    Spouse name: Not on file   Number of children: 2   Years of education: Not on file   Highest education level: Not on file  Occupational History   Occupation: Truck driver    Comment: distance driving  Tobacco Use   Smoking status: Former    Current packs/day: 0.00    Average packs/day: 1.5 packs/day for 52.0 years (78.0 ttl pk-yrs)    Types: Cigarettes    Start date: 07/14/1968    Quit date: 07/14/2020    Years since quitting: 2.9    Passive exposure: Current   Smokeless tobacco: Never  Substance and Sexual Activity   Alcohol use: No    Alcohol/week: 0.0 standard drinks of alcohol    Comment: Exposed to 2nd hand smoke   Drug use: No   Sexual activity: Not on file  Other Topics Concern   Not on file  Social History Narrative   No living will   Requests wife make decisions for him, alternate is daughter Montie   Would accept resuscitation but no prolonged ventilation   Probably wouldn't want tube feeds if cognitively unaware   Social Drivers of Health   Financial Resource Strain: Not on file  Food Insecurity: No Food Insecurity (05/24/2023)   Hunger Vital Sign    Worried About Running Out of Food in the Last Year: Never true    Ran Out  of Food in the Last Year: Never true  Transportation Needs: No Transportation Needs (05/24/2023)   PRAPARE - Administrator, Civil Service (Medical): No    Lack of Transportation (Non-Medical): No  Physical Activity: Not on file  Stress: Not on file  Social Connections: Not on file  Intimate Partner Violence: Not At Risk (05/24/2023)   Humiliation, Afraid, Rape, and Kick questionnaire    Fear of Current or Ex-Partner: No    Emotionally Abused: No    Physically Abused: No    Sexually Abused: No   Review of Systems  Constitutional:  Negative for unexpected weight change.       Walks some--but gets easy  DOE Wears seat belt  HENT:  Negative for trouble swallowing.   Eyes:  Positive for visual disturbance.  Respiratory:  Positive for shortness of breath. Negative for chest tightness.        Rare wheezing  Cardiovascular:  Negative for palpitations and leg swelling.  Gastrointestinal:  Negative for blood in stool and constipation.       No heartburn  Endocrine: Negative for polydipsia and polyuria.  Genitourinary:  Negative for difficulty urinating and urgency.       No sex--no problem  Musculoskeletal:  Negative for arthralgias, back pain and joint swelling.  Skin:  Negative for rash.  Allergic/Immunologic: Negative for environmental allergies and immunocompromised state.  Neurological:  Negative for dizziness, syncope, light-headedness and headaches.  Hematological:  Negative for adenopathy. Does not bruise/bleed easily.  Psychiatric/Behavioral:  Negative for dysphoric mood and sleep disturbance. The patient is not nervous/anxious.        Objective:   Physical Exam Constitutional:      Appearance: Normal appearance.  HENT:     Mouth/Throat:     Pharynx: No oropharyngeal exudate or posterior oropharyngeal erythema.  Eyes:     Conjunctiva/sclera: Conjunctivae normal.     Pupils: Pupils are equal, round, and reactive to light.  Cardiovascular:     Rate and Rhythm: Normal rate and regular rhythm.     Pulses: Normal pulses.     Heart sounds: No murmur heard.    No gallop.  Pulmonary:     Effort: Pulmonary effort is normal.     Breath sounds: No wheezing or rales.     Comments: Decreased breath sounds but clear Abdominal:     Palpations: Abdomen is soft.     Tenderness: There is no abdominal tenderness.  Musculoskeletal:     Cervical back: Neck supple.     Right lower leg: No edema.     Left lower leg: No edema.  Lymphadenopathy:     Cervical: No cervical adenopathy.  Skin:    Findings: No lesion or rash.  Neurological:     General: No focal deficit present.     Mental  Status: He is alert and oriented to person, place, and time.  Psychiatric:        Mood and Affect: Mood normal.        Behavior: Behavior normal.            Assessment & Plan:

## 2023-06-26 NOTE — Assessment & Plan Note (Signed)
 Hasn't needed creams recently

## 2023-06-26 NOTE — Progress Notes (Signed)
 Hearing Screening - Comments:: July 2024. No hearing aids needed Vision Screening - Comments:: January 2025

## 2023-07-24 ENCOUNTER — Other Ambulatory Visit: Payer: Self-pay | Admitting: Emergency Medicine

## 2023-07-24 DIAGNOSIS — Z87891 Personal history of nicotine dependence: Secondary | ICD-10-CM

## 2023-07-24 DIAGNOSIS — Z122 Encounter for screening for malignant neoplasm of respiratory organs: Secondary | ICD-10-CM

## 2023-08-07 ENCOUNTER — Ambulatory Visit (INDEPENDENT_AMBULATORY_CARE_PROVIDER_SITE_OTHER): Payer: Managed Care, Other (non HMO) | Admitting: Pulmonary Disease

## 2023-08-07 ENCOUNTER — Encounter: Payer: Self-pay | Admitting: Pulmonary Disease

## 2023-08-07 ENCOUNTER — Ambulatory Visit
Admission: RE | Admit: 2023-08-07 | Discharge: 2023-08-07 | Disposition: A | Discharge status: Home / Self Care | Payer: Managed Care, Other (non HMO) | Source: Ambulatory Visit | Attending: Pulmonary Disease | Admitting: Pulmonary Disease

## 2023-08-07 VITALS — BP 124/78 | HR 111 | Temp 96.9°F | Ht 72.0 in | Wt 146.8 lb

## 2023-08-07 DIAGNOSIS — J209 Acute bronchitis, unspecified: Secondary | ICD-10-CM | POA: Diagnosis not present

## 2023-08-07 DIAGNOSIS — J441 Chronic obstructive pulmonary disease with (acute) exacerbation: Secondary | ICD-10-CM | POA: Insufficient documentation

## 2023-08-07 DIAGNOSIS — Z87891 Personal history of nicotine dependence: Secondary | ICD-10-CM | POA: Diagnosis not present

## 2023-08-07 DIAGNOSIS — J449 Chronic obstructive pulmonary disease, unspecified: Secondary | ICD-10-CM

## 2023-08-07 MED ORDER — METHYLPREDNISOLONE 4 MG PO TBPK: ORAL_TABLET | ORAL | 0 refills | Status: DC

## 2023-08-07 MED ORDER — METHYLPREDNISOLONE ACETATE 80 MG/ML IJ SUSP
120.0000 mg | Freq: Once | INTRAMUSCULAR | Status: AC
  Administered 2023-08-07: 120 mg via INTRAMUSCULAR

## 2023-08-07 MED ORDER — AZITHROMYCIN 500 MG PO TABS: 500.0000 mg | ORAL_TABLET | Freq: Every day | ORAL | 0 refills | Status: AC

## 2023-08-07 NOTE — Patient Instructions (Addendum)
 VISIT SUMMARY:  Eugene Patel, a 73 year old male with advanced COPD, visited today due to a week-long experience of cough, congestion, fever, and chills. He has not noticed any significant changes in his oxygen levels and has not checked for COVID-19. He recently started using Ohtuvayre and has not used his other medications due to uncertainty about mixing them. He experiences occasional nosebleeds, which he attributes to sinus issues. There are no known allergies or issues with antibiotics.  YOUR PLAN:  -CHRONIC OBSTRUCTIVE PULMONARY DISEASE (COPD) EXACERBATION: COPD exacerbation means a worsening of your chronic lung condition, often due to an infection. We will start a prednisone taper to reduce inflammation and improve your breathing. You should also take extra strength Mucinex DM for your cough and use Fisherman's Friend cough drops. Please stay off work until the end of the week. We will follow up in 3-4 weeks.  -PNEUMONIA (RULE OUT): Pneumonia is a lung infection that can cause symptoms like fever, chills, and cough. We need to confirm this diagnosis with a chest x-ray. If pneumonia is confirmed, hospitalization may be necessary.  Chest x-ray today did not show pneumonia however you likely have a bronchitis.  -GENERAL HEALTH MAINTENANCE: It is important to continue your current medications, including Ohtuvayre, and use additional prescribed medications as needed.  INSTRUCTIONS:  Chest x-ray today ruled out pneumonia. Start the prednisone taper tomorrow as prescribed.  An antibiotic was also sent into your pharmacy this will only be for 3 days, it is very strong.  Stay off work until the Wednesday, please call if you are no better. Follow up with [place] in 3-4 weeks.

## 2023-08-07 NOTE — Progress Notes (Signed)
 Subjective:    Patient ID: Eugene Patel, male    DOB: [birth year], 73 y.o.   MRN: [id number]  Patient Care Team: Karie Schwalbe, MD as PCP - General (Pediatrics)  Chief Complaint  Patient presents with   Follow-up    SOB. Wheezing. Cough with clear sputum. Chills and sweats.    BACKGROUND/INTERVAL:Patient is a 73 year old former smoker (46 PY) who presents for follow-up on the issue of dyspnea on the basis of severe COPD. I last saw him on 29 May 2023, since that time he has received Ohtuvayre.  He presents today with acute symptoms of cough, subjective fevers, chills and sweats which started a week ago.  HPI Discussed the use of AI scribe software for clinical note transcription with the patient, who gave verbal consent to proceed.  History of Present Illness   Eugene Patel is a 73 year old male with advanced COPD who presents with cough, congestion, fever, and chills.  He has been experiencing cough, congestion, fever, and chills for the past week. The cough is non-productive, and he has not noticed any significant changes in his oxygen levels at home. He has not checked for COVID-19 or flu.  He recently started using Ohtuvayre, which he began last Saturday night, taking it twice daily. He has not used his other medications due to uncertainty about mixing them. Prednisone has been effective in alleviating his symptoms in the past.  He experiences occasional epistaxis, which he attributes to sinus issues. He has no known allergies and has not experienced any issues with antibiotics.   He has not had any chest pain.  Dates that he does not feel that this is pneumonia as he had in December.    Review of Systems A 10 point review of systems was performed and it is as noted above otherwise negative.   Patient Active Problem List   Diagnosis Date Noted   Hypophosphatemia 05/19/2023   Hyponatremia 05/19/2023   Advance directive discussed with patient 06/03/2019    Tinea cruris 01/23/2017   COPD (chronic obstructive pulmonary disease) with emphysema (HCC) 02/09/2015   Routine general medical examination at a health care facility 12/19/2012   Psoriasis 12/19/2012    Social History   Tobacco Use   Smoking status: Former    Current packs/day: 0.00    Average packs/day: 1.5 packs/day for 52.0 years (78.0 ttl pk-yrs)    Types: Cigarettes    Start date: 07/14/1968    Quit date: 07/14/2020    Years since quitting: 3.0    Passive exposure: Current   Smokeless tobacco: Never  Substance Use Topics   Alcohol use: No    Alcohol/week: 0.0 standard drinks of alcohol    Comment: Exposed to 2nd hand smoke    No Known Allergies  Current Meds  Medication Sig   OHTUVAYRE 3 MG/2.5ML SUSP Inhale 3 mg into the lungs in the morning and at bedtime.   albuterol (PROVENTIL) (2.5 MG/3ML) 0.083% nebulizer solution TAKE 3 MLS BY NEBULIZATION EVERY 6 HOURSAS NEEDED FOR WHEEZING OR SHORTNESS OF BREATH   albuterol (VENTOLIN HFA) 108 (90 Base) MCG/ACT inhaler INHALE 2 PUFFS INTO THE LUNGS 3 TIMES DAILY AS NEEDED   BREZTRI AEROSPHERE 160-9-4.8 MCG/ACT AERO INHALE TWO PUFFS INTO THE LUNGS IN THE MORNING AND AT BEDTIME   diphenhydramine-acetaminophen (TYLENOL PM) 25-500 MG TABS tablet Take 1 tablet by mouth at bedtime as needed.   triamcinolone cream (KENALOG) 0.1 % APPLY TOPICALLY TWICE DAILY AS NEEDED.  Immunization History  Administered Date(s) Administered   Fluad Quad(high Dose 65+) 06/03/2019, 06/08/2020, 06/21/2021   Fluad Trivalent(High Dose 65+) 05/01/2023   Influenza,inj,Quad PF,6+ Mos 02/09/2015   PFIZER(Purple Top)SARS-COV-2 Vaccination 08/12/2019, 09/12/2019   Pneumococcal Conjugate-13 02/09/2015   Pneumococcal Polysaccharide-23 01/18/2016   Tdap 12/19/2012        Objective:     BP 124/78 (BP Location: Right Arm, Cuff Size: Normal)   Pulse (!) 111   Temp (!) 96.9 F (36.1 C)   Ht 6' (1.829 m)   Wt 146 lb 12.8 oz (66.6 kg)   SpO2 97%   BMI  19.91 kg/m   SpO2: 97 % O2 Device: None (Room air)  GENERAL: Thin well-developed gentleman in no respiratory distress.  No use of accessories noted.  No conversational dyspnea.  Looks acutely ill but nontoxic. HEAD: Normocephalic, atraumatic.  EYES: Pupils equal, round, reactive to light.  No scleral icterus.  MOUTH: Wears dentures uppers, lowers.  Oral mucosa moist, no thrush. NECK: Supple. No thyromegaly. Trachea midline. No JVD.  No adenopathy. PULMONARY: Distant breath sounds bilaterally.  Poor air movement.  Coarse,otherwise, no adventitious sounds.Positive Hoover's sign (chronic). CARDIOVASCULAR: S1 and S2. Regular rate and rhythm.  No rubs, murmurs or gallops heard. ABDOMEN: Scaphoid, otherwise benign. MUSCULOSKELETAL: No joint deformity, no clubbing, no edema.  NEUROLOGIC: No overt focal deficit, no gait disturbance, speech is fluent. SKIN: Intact,warm,dry. PSYCH: Mood and behavior normal.   Chest x-ray was performed today and showed no acute infiltrate, there are severe COPD changes:    Assessment & Plan:     ICD-10-CM   1. COPD with acute exacerbation (HCC)  J44.1 DG Chest 2 View    methylPREDNISolone acetate (DEPO-MEDROL) injection 120 mg    2. Stage 4 very severe COPD by GOLD classification (HCC)  J44.9     3. Acute bronchitis with COPD (HCC)  J44.0    J20.9     4. Former heavy tobacco smoker  Z87.891      Orders Placed This Encounter  Procedures   DG Chest 2 View    Standing Status:   Future    Number of Occurrences:   1    Expected Date:   08/07/2023    Expiration Date:   08/06/2024    Reason for Exam (SYMPTOM  OR DIAGNOSIS REQUIRED):   R/O Pneumonia    Preferred imaging location?:   [facility]   Meds ordered this encounter  Medications   azithromycin (ZITHROMAX) 500 MG tablet    Sig: Take 1 tablet (500 mg total) by mouth daily for 3 days. Take 1 tablet daily for 3 days.    Dispense:  3 tablet    Refill:  0   methylPREDNISolone (MEDROL DOSEPAK)  4 MG TBPK tablet    Sig: Take as directed in package.    Dispense:  21 tablet    Refill:  0   methylPREDNISolone acetate (DEPO-MEDROL) injection 120 mg   Discussion:    Chronic Obstructive Pulmonary Disease (COPD) Exacerbation Jayanth Szczesniak presents with an exacerbation of advanced COPD, characterized by cough, congestion, fever, chills, dyspnea, and occasional epistaxis for the past week. Likely infectious etiology, possibly bronchitis. Discussed prednisone for inflammation and improved breathing. No known allergies or issues with antibiotics. - Chest x-ray did not show evidence of pneumonia, severe COPD changes - Administer Depo-Medrol 80 mg injection - Start Medrol taper tomorrow - Azithromycin 500 mg p.o. x 3 days - Recommend Mucinex DM extra strength, twice a day for cough -  Advise use of Fisherman's Friend cough drops (patient finds effective) - Advise to stay off work until for 2 days - Follow-up in 3-4 weeks  Pneumonia (Rule Out) Given symptoms of fever, chills, and productive cough, pneumonia is a differential diagnosis requiring confirmation through imaging. Discussed potential need for hospitalization if confirmed. - Order chest x-ray RULED OUT pneumonia - Suspect acute bacterial bronchitis in the setting of COPD - Azithromycin 500 mg p.o. x 3 days  General Health Maintenance Discussed the importance of continuing current medications and adding new treatments as needed. - Continue current medications including Ohtuvayre - Advised he can use his Breztri and as needed albuterol - Use additional prescribed medications as needed.      Advised if symptoms do not improve or worsen, to please contact office for sooner follow up or seek emergency care.    I spent 42 minutes of dedicated to the care of this patient on the date of this encounter to include pre-visit review of records, face-to-face time with the patient discussing conditions above, post visit ordering of testing,  clinical documentation with the electronic health record, making appropriate referrals as documented, and communicating necessary findings to members of the patients care team.    C. Danice Goltz, MD Advanced Bronchoscopy PCCM [facility]    *This note was generated using voice recognition software/Dragon and/or AI transcription program.  Despite best efforts to proofread, errors can occur which can change the meaning. Any transcriptional errors that result from this process are unintentional and may not be fully corrected at the time of dictation.

## 2023-08-09 ENCOUNTER — Other Ambulatory Visit: Payer: Self-pay | Admitting: Pulmonary Disease

## 2023-09-04 ENCOUNTER — Ambulatory Visit: Payer: Managed Care, Other (non HMO) | Admitting: Pulmonary Disease

## 2023-09-06 ENCOUNTER — Ambulatory Visit: Admitting: Pulmonary Disease

## 2023-09-08 DIAGNOSIS — H18413 Arcus senilis, bilateral: Secondary | ICD-10-CM | POA: Diagnosis not present

## 2023-09-08 DIAGNOSIS — H2511 Age-related nuclear cataract, right eye: Secondary | ICD-10-CM | POA: Diagnosis not present

## 2023-09-08 DIAGNOSIS — H25043 Posterior subcapsular polar age-related cataract, bilateral: Secondary | ICD-10-CM | POA: Diagnosis not present

## 2023-09-08 DIAGNOSIS — H2513 Age-related nuclear cataract, bilateral: Secondary | ICD-10-CM | POA: Diagnosis not present

## 2023-09-08 DIAGNOSIS — H25013 Cortical age-related cataract, bilateral: Secondary | ICD-10-CM | POA: Diagnosis not present

## 2023-09-11 ENCOUNTER — Encounter: Payer: Self-pay | Admitting: Pulmonary Disease

## 2023-09-11 ENCOUNTER — Ambulatory Visit (INDEPENDENT_AMBULATORY_CARE_PROVIDER_SITE_OTHER): Admitting: Pulmonary Disease

## 2023-09-11 VITALS — BP 122/70 | HR 101 | Temp 96.8°F | Ht 72.0 in | Wt 143.0 lb

## 2023-09-11 DIAGNOSIS — H2511 Age-related nuclear cataract, right eye: Secondary | ICD-10-CM | POA: Diagnosis not present

## 2023-09-11 DIAGNOSIS — J449 Chronic obstructive pulmonary disease, unspecified: Secondary | ICD-10-CM

## 2023-09-11 DIAGNOSIS — Z87891 Personal history of nicotine dependence: Secondary | ICD-10-CM | POA: Diagnosis not present

## 2023-09-11 DIAGNOSIS — H2513 Age-related nuclear cataract, bilateral: Secondary | ICD-10-CM | POA: Diagnosis not present

## 2023-09-11 DIAGNOSIS — H25043 Posterior subcapsular polar age-related cataract, bilateral: Secondary | ICD-10-CM | POA: Diagnosis not present

## 2023-09-11 DIAGNOSIS — R0602 Shortness of breath: Secondary | ICD-10-CM

## 2023-09-11 DIAGNOSIS — H25013 Cortical age-related cataract, bilateral: Secondary | ICD-10-CM | POA: Diagnosis not present

## 2023-09-11 DIAGNOSIS — H18413 Arcus senilis, bilateral: Secondary | ICD-10-CM | POA: Diagnosis not present

## 2023-09-11 NOTE — Progress Notes (Signed)
 Subjective:    Patient ID: Eugene Patel, male    DOB: 1951/04/28, 73 y.o.   MRN: [id number]  Patient Care Team: Karie Schwalbe, MD as PCP - General (Pediatrics)  Chief Complaint  Patient presents with   Follow-up    DOE. No wheezing or cough.     BACKGROUND/INTERVAL: Patient is a 73 year old former smoker (1 PY) who presents for follow-up on the issue of dyspnea on the basis of severe COPD. I last saw him on 07 August 2023, he has been on [place].  At his prior visit he had issues with COPD exacerbation.  Notes he is feeling better.  HPI Discussed the use of AI scribe software for clinical note transcription with the patient, who gave verbal consent to proceed.  History of Present Illness   Eugene Patel is a 73 year old male with COPD who presents for follow-up after a recent exacerbation.  He experienced a recent exacerbation of COPD, which he associates with the beginning of pollen season, typically occurring annually around February. During the last episode, he was treated with azithromycin, prednisone, and received a Medrol injection, which improved his breathing. He feels significantly better compared to his last visit.  He continues to use a new medication Sharyn Blitz) twice daily as instructed, along with Breztri, also twice daily. He monitors his oxygen levels at home, which range from 96 to 98%. He experiences shortness of breath with exertion but has no issues with sleeping or the need for supplemental oxygen at night, however he has not been tested for nocturnal oxygen needs.  He has not smoked in sixteen months, although he occasionally desires a cigarette.  He mentions upcoming cataract surgeries scheduled for June 6th and June 20th.     Review of Systems A 10 point review of systems was performed and it is as noted above otherwise negative.   Patient Active Problem List   Diagnosis Date Noted   Hypophosphatemia 05/19/2023   Hyponatremia 05/19/2023    Advance directive discussed with patient 06/03/2019   Tinea cruris 01/23/2017   COPD (chronic obstructive pulmonary disease) with emphysema (HCC) 02/09/2015   Routine general medical examination at a health care facility 12/19/2012   Psoriasis 12/19/2012    Social History   Tobacco Use   Smoking status: Former    Current packs/day: 0.00    Average packs/day: 1.5 packs/day for 52.0 years (78.0 ttl pk-yrs)    Types: Cigarettes    Start date: 07/14/1968    Quit date: 07/14/2020    Years since quitting: 3.1    Passive exposure: Current   Smokeless tobacco: Never  Substance Use Topics   Alcohol use: No    Alcohol/week: 0.0 standard drinks of alcohol    Comment: Exposed to 2nd hand smoke    No Known Allergies  Current Meds  Medication Sig   albuterol (PROVENTIL) (2.5 MG/3ML) 0.083% nebulizer solution TAKE 3 MLS BY NEBULIZATION EVERY 6 HOURSAS NEEDED FOR WHEEZING OR SHORTNESS OF BREATH   albuterol (VENTOLIN HFA) 108 (90 Base) MCG/ACT inhaler INHALE 2 PUFFS INTO THE LUNGS 3 TIMES DAILY AS NEEDED   BREZTRI AEROSPHERE 160-9-4.8 MCG/ACT AERO INHALE TWO PUFFS INTO THE LUNGS IN THE MORNING AND AT BEDTIME   diphenhydramine-acetaminophen (TYLENOL PM) 25-500 MG TABS tablet Take 1 tablet by mouth at bedtime as needed.   OHTUVAYRE 3 MG/2.5ML SUSP Inhale 3 mg into the lungs in the morning and at bedtime.   triamcinolone cream (KENALOG) 0.1 % APPLY TOPICALLY TWICE DAILY  AS NEEDED.    Immunization History  Administered Date(s) Administered   Fluad Quad(high Dose 65+) 06/03/2019, 06/08/2020, 06/21/2021   Fluad Trivalent(High Dose 65+) 05/01/2023   Influenza,inj,Quad PF,6+ Mos 02/09/2015   PFIZER(Purple Top)SARS-COV-2 Vaccination 08/12/2019, 09/12/2019   Pneumococcal Conjugate-13 02/09/2015   Pneumococcal Polysaccharide-23 01/18/2016   Tdap 12/19/2012        Objective:     BP 122/70 (BP Location: Right Arm, Cuff Size: Normal)   Pulse (!) 101   Temp (!) 96.8 F (36 C)   Ht 6' (1.829 m)    Wt 143 lb (64.9 kg)   SpO2 94%   BMI 19.39 kg/m   SpO2: 94 % O2 Device: None (Room air)  GENERAL: Thin well-developed gentleman in no respiratory distress.  No use of accessories noted today.  No conversational dyspnea.  Looks acutely ill but nontoxic. HEAD: Normocephalic, atraumatic.  EYES: Pupils equal, round, reactive to light.  No scleral icterus.  MOUTH: Wears dentures uppers, lowers.  Oral mucosa moist, no thrush. NECK: Supple. No thyromegaly. Trachea midline. No JVD.  No adenopathy. PULMONARY: Symmetrical breath sounds, moving air well.  Coarse,otherwise, no adventitious sounds. CARDIOVASCULAR: S1 and S2. Regular rate and rhythm.  No rubs, murmurs or gallops heard. ABDOMEN: Scaphoid, otherwise benign. MUSCULOSKELETAL: No joint deformity, no clubbing, no edema.  NEUROLOGIC: No overt focal deficit, no gait disturbance, speech is fluent. SKIN: Intact,warm,dry. PSYCH: Mood and behavior normal.     Ambulatory oxymetry was performed today:  At rest on room air oxygen saturation was 98%, the patient ambulated at a moderate pace, completed 3 laps, O2 nadir 96%, moderate shortness of breath.  Resting heart rate was 105 bpm at maximum for this exercise 123 bpm.   Assessment & Plan:     ICD-10-CM   1. Stage 4 very severe COPD by GOLD classification (HCC)  J44.9 Overnight Pulse Oximetry Study    2. Shortness of breath  R06.02 Overnight Pulse Oximetry Study    3. Former heavy tobacco smoker  Z87.891       Orders Placed This Encounter  Procedures   Overnight Pulse Oximetry Study    On room Air DME: [employer]    Standing Status:   Future    Expiration Date:   09/10/2024   Discussion:    Chronic Obstructive Pulmonary Disease (COPD) COPD with annual exacerbations, typically in February, possibly related to pollen season. Recent exacerbation managed with azithromycin and prednisone, resulting in significant symptom improvement. Reports feeling much better, with no current wheezing,  but experiences dyspnea on exertion. Compliant with prescribed inhaler regimen, using Breztri twice daily, and has successfully quit smoking for 16 months. Oxygen saturation levels remain stable during the day, but nocturnal desaturation needs assessment.  Ambulatory oximetry today did not show significant oxygen desaturation.  Oxygen nadir with ambulation was 96%. - Continue Breztri twice daily. - Perform overnight oximetry to assess nocturnal oxygen saturation. - Schedule follow-up appointment in three months.  Follow-up for Cataract Surgery Scheduled for cataract surgery on June 6th and June 20th, not expected to interfere with COPD management. - Ensure follow-up appointment in three months.     Advised if symptoms do not improve or worsen, to please contact office for sooner follow up or seek emergency care.    I spent 32 minutes of dedicated to the care of this patient on the date of this encounter to include pre-visit review of records, face-to-face time with the patient discussing conditions above, post visit ordering of testing, clinical documentation with the  electronic health record, making appropriate referrals as documented, and communicating necessary findings to members of the patients care team.   C. Danice Goltz, MD Advanced Bronchoscopy PCCM [facility]    *This note was generated using voice recognition software/Dragon and/or AI transcription program.  Despite best efforts to proofread, errors can occur which can change the meaning. Any transcriptional errors that result from this process are unintentional and may not be fully corrected at the time of dictation.

## 2023-09-11 NOTE — Patient Instructions (Signed)
 VISIT SUMMARY:  You had a follow-up appointment today to check on your COPD after a recent exacerbation. You mentioned that your breathing has improved significantly since your last visit, and you are continuing with your prescribed medications. You also discussed your upcoming cataract surgeries.  YOUR PLAN:  -CHRONIC OBSTRUCTIVE PULMONARY DISEASE (COPD): COPD is a chronic lung condition that makes it hard to breathe. You had a recent flare-up, likely due to pollen, which was treated with medications that improved your symptoms. You should continue using Breztri twice daily. We will also perform an overnight oximetry test to check your oxygen levels while you sleep. Please schedule a follow-up appointment in three months.  -FOLLOW-UP FOR CATARACT SURGERY: You have cataract surgeries scheduled for June 6th and June 20th. These surgeries should not interfere with your COPD management. Please ensure you have a follow-up appointment in three months.  INSTRUCTIONS:  Please schedule a follow-up appointment in three months. We will also perform an overnight oximetry test to assess your oxygen levels while you sleep.

## 2023-11-15 ENCOUNTER — Telehealth: Payer: Self-pay

## 2023-11-15 NOTE — Telephone Encounter (Signed)
 Patient completed Overnight Pulse-Oximetry on 11/12/2023-11/13/2023. Dr. Viva Grise reviewed and recommended  2L oxygen at night.  Patient declined. 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  Nothing further needed.

## 2023-11-16 DIAGNOSIS — H2511 Age-related nuclear cataract, right eye: Secondary | ICD-10-CM | POA: Diagnosis not present

## 2023-11-17 DIAGNOSIS — H2512 Age-related nuclear cataract, left eye: Secondary | ICD-10-CM | POA: Diagnosis not present

## 2023-11-17 DIAGNOSIS — H2511 Age-related nuclear cataract, right eye: Secondary | ICD-10-CM | POA: Diagnosis not present

## 2023-11-21 ENCOUNTER — Encounter: Payer: Self-pay | Admitting: Pulmonary Disease

## 2023-12-01 DIAGNOSIS — H2512 Age-related nuclear cataract, left eye: Secondary | ICD-10-CM | POA: Diagnosis not present

## 2023-12-01 DIAGNOSIS — H25042 Posterior subcapsular polar age-related cataract, left eye: Secondary | ICD-10-CM | POA: Diagnosis not present

## 2024-01-01 ENCOUNTER — Encounter: Payer: Self-pay | Admitting: Pulmonary Disease

## 2024-01-01 ENCOUNTER — Ambulatory Visit: Admitting: Pulmonary Disease

## 2024-01-01 VITALS — BP 112/62 | HR 100 | Ht 72.0 in | Wt 144.0 lb

## 2024-01-01 DIAGNOSIS — R0602 Shortness of breath: Secondary | ICD-10-CM

## 2024-01-01 DIAGNOSIS — J449 Chronic obstructive pulmonary disease, unspecified: Secondary | ICD-10-CM | POA: Diagnosis not present

## 2024-01-01 DIAGNOSIS — Z87891 Personal history of nicotine dependence: Secondary | ICD-10-CM | POA: Diagnosis not present

## 2024-01-01 NOTE — Progress Notes (Signed)
 Subjective:    Patient ID: Eugene Patel, male    DOB: [birth year], 73 y.o.   MRN: [id number]  Patient Care Team: Jimmy Charlie FERNS, MD as PCP - General (Pediatrics)  Chief Complaint  Patient presents with   COPD    BACKGROUND/INTERVAL:Patient is a 73 year old former smoker (63 PY) who presents for follow-up on the issue of dyspnea on the basis of severe COPD. I last saw him on 11 September 2023, he has been on Ohtuvayre .  No exacerbations since his last visit.  HPI Discussed the use of AI scribe software for clinical note transcription with the patient, who gave verbal consent to proceed.  History of Present Illness   Eugene Patel is a 73 year old male with severe COPD who presents for follow-up regarding his medication regimen.  He has severe chronic obstructive pulmonary disease (COPD) and is currently on a medication regimen that includes Ohtuvayre  which he started recently. He uses Ohtuvayre  twice feels that it is working well. He has not experienced any exacerbations since his last visit.  He uses albuterol  inhaler very rarely.  He has been smoke-free for nearly two years, with his last cigarette being around Thanksgiving two years ago. He continues to use a nebulizer as part of his treatment plan, and his prescription for the nebulizer medication was recently refilled.  No recent changes in sputum production or color and no current symptoms of respiratory infection. He reports that 'nothing comes up when I cough.'   He qualified for nocturnal oxygen however he declined the therapy.     Review of Systems A 10 point review of systems was performed and it is as noted above otherwise negative.   Patient Active Problem List   Diagnosis Date Noted   Hypophosphatemia 05/19/2023   Hyponatremia 05/19/2023   Advance directive discussed with patient 06/03/2019   Tinea cruris 01/23/2017   COPD (chronic obstructive pulmonary disease) with emphysema (HCC) 02/09/2015   Routine  general medical examination at a health care facility 12/19/2012   Psoriasis 12/19/2012    Social History   Tobacco Use   Smoking status: Former    Current packs/day: 0.00    Average packs/day: 1.5 packs/day for 52.0 years (78.0 ttl pk-yrs)    Types: Cigarettes    Start date: 07/14/1968    Quit date: 07/14/2020    Years since quitting: 3.4    Passive exposure: Current   Smokeless tobacco: Never  Substance Use Topics   Alcohol use: No    Alcohol/week: 0.0 standard drinks of alcohol    Comment: Exposed to 2nd hand smoke    No Known Allergies  Current Meds  Medication Sig   albuterol  (PROVENTIL ) (2.5 MG/3ML) 0.083% nebulizer solution TAKE 3 MLS BY NEBULIZATION EVERY 6 HOURSAS NEEDED FOR WHEEZING OR SHORTNESS OF BREATH   albuterol  (VENTOLIN  HFA) 108 (90 Base) MCG/ACT inhaler INHALE 2 PUFFS INTO THE LUNGS 3 TIMES DAILY AS NEEDED   BREZTRI  AEROSPHERE 160-9-4.8 MCG/ACT AERO INHALE TWO PUFFS INTO THE LUNGS IN THE MORNING AND AT BEDTIME   diphenhydramine-acetaminophen  (TYLENOL  PM) 25-500 MG TABS tablet Take 1 tablet by mouth at bedtime as needed.   ketorolac (ACULAR) 0.5 % ophthalmic solution Place 1 drop into the left eye 4 (four) times daily.   moxifloxacin (VIGAMOX) 0.5 % ophthalmic solution Place 1 drop into the left eye 4 (four) times daily.   OHTUVAYRE  3 MG/2.5ML SUSP Inhale 3 mg into the lungs in the morning and at bedtime.   prednisoLONE acetate (  PRED FORTE) 1 % ophthalmic suspension SMARTSIG:In Eye(s)   triamcinolone  cream (KENALOG ) 0.1 % APPLY TOPICALLY TWICE DAILY AS NEEDED.    Immunization History  Administered Date(s) Administered   Fluad Quad(high Dose 65+) 06/03/2019, 06/08/2020, 06/21/2021   Fluad Trivalent(High Dose 65+) 05/01/2023   Influenza,inj,Quad PF,6+ Mos 02/09/2015   PFIZER(Purple Top)SARS-COV-2 Vaccination 08/12/2019, 09/12/2019   Pneumococcal Conjugate-13 02/09/2015   Pneumococcal Polysaccharide-23 01/18/2016   Tdap 12/19/2012        Objective:      BP 112/62 (BP Location: Left Arm, Patient Position: Sitting, Cuff Size: Normal)   Pulse 100   Ht 6' (1.829 m)   Wt 144 lb (65.3 kg)   SpO2 99%   BMI 19.53 kg/m   SpO2: 99 %  GENERAL: Thin well-developed gentleman in no respiratory distress.  No use of accessories noted today.  No conversational dyspnea.  Looks acutely ill but nontoxic. HEAD: Normocephalic, atraumatic.  EYES: Pupils equal, round, reactive to light.  No scleral icterus.  MOUTH: Wears dentures uppers, lowers.  Oral mucosa moist, no thrush. NECK: Supple. No thyromegaly. Trachea midline. No JVD.  No adenopathy. PULMONARY: Symmetrical breath sounds, moving air well.  Coarse,otherwise, no adventitious sounds. CARDIOVASCULAR: S1 and S2. Regular rate and rhythm.  No rubs, murmurs or gallops heard. ABDOMEN: Scaphoid, otherwise benign. MUSCULOSKELETAL: No joint deformity, no clubbing, no edema.  NEUROLOGIC: No overt focal deficit, no gait disturbance, speech is fluent. SKIN: Intact,warm,dry. PSYCH: Mood and behavior normal.        Assessment & Plan:     ICD-10-CM   1. Stage 4 very severe COPD by GOLD classification (HCC)  J44.9     2. Shortness of breath  R06.02     3. Former heavy tobacco smoker  Z87.891      Discussion:    Severe COPD Severe COPD with ongoing management.  Currently on Ohtuvayre , expected to improve lung function by reducing inflammation specific to COPD. He reports daily use. He has abstained from smoking for two years, beneficial for his condition. He uses albuterol  infrequently, indicating well-controlled symptoms. No current exacerbations or changes in sputum production. - Continue Ohtuvayre  twice daily. - Continue using albuterol  as needed. - Continue nebulizer treatment as prescribed. - Schedule lung cancer screening around December. - Return for follow-up in four months. - Instruct to call if there are changes in sputum color or signs of infection.      Advised if symptoms do not  improve or worsen, to please contact office for sooner follow up or seek emergency care.    I spent 31 minutes of dedicated to the care of this patient on the date of this encounter to include pre-visit review of records, face-to-face time with the patient discussing conditions above, post visit ordering of testing, clinical documentation with the electronic health record, making appropriate referrals as documented, and communicating necessary findings to members of the patients care team.     C. Leita Sanders, MD Advanced Bronchoscopy P[facility]    *This note was generated using voice recognition software/Dragon and/or AI transcription program.  Despite best efforts to proofread, errors can occur which can change the meaning. Any transcriptional errors that result from this process are unintentional and may not be fully corrected at the time of dictation.

## 2024-01-01 NOTE — Patient Instructions (Signed)
 VISIT SUMMARY:  You came in today for a follow-up regarding your medication regimen for severe chronic obstructive pulmonary disease (COPD). You have been using Ohtuvayre  twice daily and have not experienced any exacerbations requiring increased use of your rescue inhaler, albuterol . You have been smoke-free for nearly two years and continue to use a nebulizer as part of your treatment plan.  You are using Breztri  twice a day.  There have been no recent changes in your sputum production or color, and you have no current symptoms of respiratory infection.  YOUR PLAN:  -SEVERE COPD: Severe COPD is a chronic lung disease that makes it hard to breathe due to airflow blockage and breathing-related problems. You should continue using Ohtuvayre  twice daily, albuterol  as needed, and your Breztri  twice a day as prescribed. It's great that you have been smoke-free for two years, as this is beneficial for your condition. Please call if you notice any changes in sputum color or signs of infection.  INSTRUCTIONS:  Please schedule a lung cancer screening around December and return for a follow-up in four months.

## 2024-04-01 ENCOUNTER — Encounter: Payer: Self-pay | Admitting: Acute Care

## 2024-04-20 ENCOUNTER — Other Ambulatory Visit: Payer: Self-pay | Admitting: Pulmonary Disease

## 2024-04-20 DIAGNOSIS — J449 Chronic obstructive pulmonary disease, unspecified: Secondary | ICD-10-CM

## 2024-04-22 ENCOUNTER — Other Ambulatory Visit (HOSPITAL_COMMUNITY): Payer: Self-pay

## 2024-04-22 ENCOUNTER — Telehealth: Payer: Self-pay

## 2024-04-22 NOTE — Telephone Encounter (Signed)
 Received fax from [facility] stating pa for Ohtuvayre  is about to expire. Submitted a Prior Authorization request to [employer] for OHTUVAYRE  via CoverMyMeds. Authorization has been APPROVED from 03/23/24 to 04/22/25. Have not yet received approval letter.  Unable to run test claim because it's refill too soon until 04/25/24. Authorization #: [id number]

## 2024-04-22 NOTE — Telephone Encounter (Signed)
 Refill sent for OHTUVAYRE  to [facility]  Dose: 3mg  by nebulizer twice daily   Last OV: 01/01/2024 Provider: Dr. Tamea  Next OV: 05/03/24  Aleck Puls, PharmD, BCPS Clinical Pharmacist  [facility]

## 2024-05-03 ENCOUNTER — Ambulatory Visit: Admitting: Pulmonary Disease

## 2024-07-01 ENCOUNTER — Encounter

## 2024-07-08 ENCOUNTER — Ambulatory Visit: Admitting: Pulmonary Disease

## 2024-07-22 ENCOUNTER — Ambulatory Visit: Admitting: Pulmonary Disease

## 2024-08-26 ENCOUNTER — Encounter
